# Patient Record
Sex: Female | Born: 1985
Health system: Southern US, Community
[De-identification: ages and names within clinical notes are randomized; demographics above are authoritative.]

## PROBLEM LIST (undated history)

## (undated) DIAGNOSIS — G40909 Epilepsy, unspecified, not intractable, without status epilepticus: Secondary | ICD-10-CM

## (undated) DIAGNOSIS — I639 Cerebral infarction, unspecified: Secondary | ICD-10-CM

## (undated) DIAGNOSIS — R638 Other symptoms and signs concerning food and fluid intake: Secondary | ICD-10-CM

## (undated) DIAGNOSIS — B3731 Acute candidiasis of vulva and vagina: Secondary | ICD-10-CM

## (undated) DIAGNOSIS — L709 Acne, unspecified: Secondary | ICD-10-CM

## (undated) DIAGNOSIS — N926 Irregular menstruation, unspecified: Secondary | ICD-10-CM

## (undated) DIAGNOSIS — F419 Anxiety disorder, unspecified: Secondary | ICD-10-CM

## (undated) DIAGNOSIS — B373 Candidiasis of vulva and vagina: Secondary | ICD-10-CM

## (undated) HISTORY — DX: Other symptoms and signs concerning food and fluid intake: R63.8

## (undated) HISTORY — DX: Candidiasis of vulva and vagina: B37.3

## (undated) HISTORY — DX: Anxiety disorder, unspecified: F41.9

## (undated) HISTORY — DX: Acne, unspecified: L70.9

## (undated) HISTORY — DX: Irregular menstruation, unspecified: N92.6

## (undated) HISTORY — PX: CHOLECYSTECTOMY: SHX55

## (undated) HISTORY — DX: Cerebral infarction, unspecified: I63.9

## (undated) HISTORY — DX: Acute candidiasis of vulva and vagina: B37.31

## (undated) HISTORY — PX: TONSILLECTOMY AND ADENOIDECTOMY: SUR1326

## (undated) HISTORY — DX: Epilepsy, unspecified, not intractable, without status epilepticus: G40.909

---

## 2007-07-19 ENCOUNTER — Emergency Department: Payer: Self-pay | Admitting: Emergency Medicine

## 2011-02-25 ENCOUNTER — Encounter: Payer: Self-pay | Admitting: Maternal and Fetal Medicine

## 2011-06-25 ENCOUNTER — Observation Stay: Payer: Self-pay | Admitting: Obstetrics and Gynecology

## 2011-06-28 ENCOUNTER — Observation Stay: Payer: Self-pay | Admitting: Obstetrics and Gynecology

## 2011-07-03 ENCOUNTER — Observation Stay: Payer: Self-pay | Admitting: Obstetrics and Gynecology

## 2011-07-03 LAB — PIH PROFILE
BUN: 9 mg/dL (ref 7–18)
Calcium, Total: 8.8 mg/dL (ref 8.5–10.1)
Co2: 22 mmol/L (ref 21–32)
Creatinine: 0.6 mg/dL (ref 0.60–1.30)
EGFR (Non-African Amer.): >60
Glucose: 69 mg/dL (ref 65–99)
HGB: 12.5 g/dL (ref 12.0–16.0)
MCH: 29.3 pg (ref 26.0–34.0)
MCHC: 34.6 g/dL (ref 32.0–36.0)
MCV: 85 fL (ref 80–100)
Potassium: 4.3 mmol/L (ref 3.5–5.1)
RDW: 13.2 % (ref 11.5–14.5)
SGOT(AST): 14 U/L — ABNORMAL LOW (ref 15–37)
Sodium: 139 mmol/L (ref 136–145)

## 2011-07-03 LAB — PROTEIN / CREATININE RATIO, URINE
Creatinine, Urine: 121.3 mg/dL (ref 30.0–125.0)
Protein/Creat. Ratio: 280 mg/gCREAT — ABNORMAL HIGH (ref 0–200)

## 2011-07-06 ENCOUNTER — Inpatient Hospital Stay: Payer: Self-pay | Admitting: Obstetrics and Gynecology

## 2011-07-07 LAB — PROTEIN / CREATININE RATIO, URINE
Creatinine, Urine: 76.5 mg/dL (ref 30.0–125.0)
Protein, Random Urine: 16 mg/dL — ABNORMAL HIGH (ref 0–12)
Protein/Creat. Ratio: 209 mg/gCREAT — ABNORMAL HIGH (ref 0–200)

## 2011-07-07 LAB — CBC WITH DIFFERENTIAL/PLATELET
Basophil #: 0 10*3/uL (ref 0.0–0.1)
Basophil %: 0.3 %
HCT: 36.6 % (ref 35.0–47.0)
HGB: 12.4 g/dL (ref 12.0–16.0)
Lymphocyte %: 20 %
MCH: 29.1 pg (ref 26.0–34.0)
MCHC: 33.9 g/dL (ref 32.0–36.0)
Monocyte %: 4.6 %
Neutrophil #: 8.3 10*3/uL — ABNORMAL HIGH (ref 1.4–6.5)
Neutrophil %: 74.8 %
Platelet: 248 10*3/uL (ref 150–440)
RDW: 13.9 % (ref 11.5–14.5)

## 2011-08-07 ENCOUNTER — Ambulatory Visit: Payer: Self-pay | Admitting: Neurology

## 2011-09-10 ENCOUNTER — Ambulatory Visit: Payer: Self-pay | Admitting: Obstetrics and Gynecology

## 2011-10-02 ENCOUNTER — Ambulatory Visit: Payer: Self-pay | Admitting: Surgery

## 2011-10-03 LAB — PATHOLOGY REPORT

## 2011-12-16 DIAGNOSIS — G40209 Localization-related (focal) (partial) symptomatic epilepsy and epileptic syndromes with complex partial seizures, not intractable, without status epilepticus: Secondary | ICD-10-CM | POA: Insufficient documentation

## 2012-01-13 ENCOUNTER — Emergency Department: Payer: Self-pay | Admitting: Emergency Medicine

## 2012-01-13 LAB — URINALYSIS, COMPLETE
Bacteria: NONE SEEN
Bilirubin,UR: NEGATIVE
Glucose,UR: NEGATIVE mg/dL (ref 0–75)
Leukocyte Esterase: NEGATIVE
Nitrite: NEGATIVE
Ph: 6 (ref 4.5–8.0)
Protein: NEGATIVE
RBC,UR: 2 /HPF (ref 0–5)

## 2012-01-13 LAB — CBC
HCT: 41.7 % (ref 35.0–47.0)
HGB: 14.6 g/dL (ref 12.0–16.0)
MCH: 28.9 pg (ref 26.0–34.0)
MCHC: 34.9 g/dL (ref 32.0–36.0)
MCV: 83 fL (ref 80–100)
Platelet: 230 10*3/uL (ref 150–440)
RDW: 14.2 % (ref 11.5–14.5)

## 2012-01-13 LAB — COMPREHENSIVE METABOLIC PANEL
Anion Gap: 4 — ABNORMAL LOW (ref 7–16)
Calcium, Total: 9.4 mg/dL (ref 8.5–10.1)
Co2: 30 mmol/L (ref 21–32)
Creatinine: 0.62 mg/dL (ref 0.60–1.30)
EGFR (African American): >60
EGFR (Non-African Amer.): >60
Glucose: 85 mg/dL (ref 65–99)
Osmolality: 272 (ref 275–301)
Potassium: 3.8 mmol/L (ref 3.5–5.1)
SGOT(AST): 21 U/L (ref 15–37)
Sodium: 137 mmol/L (ref 136–145)
Total Protein: 7.7 g/dL (ref 6.4–8.2)

## 2012-01-13 LAB — DRUG SCREEN, URINE
Amphetamines, Ur Screen: NEGATIVE (ref ?–1000)
Barbiturates, Ur Screen: NEGATIVE (ref ?–200)
Cocaine Metabolite,Ur ~~LOC~~: NEGATIVE (ref ?–300)
MDMA (Ecstasy)Ur Screen: NEGATIVE (ref ?–500)
Opiate, Ur Screen: NEGATIVE (ref ?–300)
Phencyclidine (PCP) Ur S: NEGATIVE (ref ?–25)
Tricyclic, Ur Screen: NEGATIVE (ref ?–1000)

## 2012-01-13 LAB — APTT: Activated PTT: 32.9 secs (ref 23.6–35.9)

## 2012-01-13 LAB — ETHANOL: Ethanol %: <0.003 % (ref 0.000–0.080)

## 2012-01-13 LAB — PROTIME-INR
INR: 0.9
Prothrombin Time: 12.6 secs (ref 11.5–14.7)

## 2012-10-23 ENCOUNTER — Ambulatory Visit: Payer: Self-pay

## 2014-03-02 LAB — HM PAP SMEAR: HM PAP: NEGATIVE

## 2014-08-28 NOTE — Op Note (Signed)
PATIENT NAME:  Briana Ward, Briana Ward MR#:  [id number] DATE OF BIRTH:  [birth year]  DATE OF PROCEDURE:  07/07/2011  PREOPERATIVE DIAGNOSES:  1. 39.0 week intrauterine pregnancy, undelivered.  2. Chronic hypertension.   3. Partial complex seizure disorder.  4. GBS positive.  5. Arrest of dilation.  POSTOPERATIVE DIAGNOSES: 1. 39.0 week intrauterine pregnancy, undelivered.  2. Chronic hypertension.   3. Partial complex seizure disorder.  4. GBS positive.  5. Arrest of dilation. 6. Viable female infant 5 pounds, 15 ounces.   OPERATIVE PROCEDURE: Primary low cervical transverse Cesarean section.   SURGEON: Prentice DockerMartin A. Lekendrick Alpern, MD    FIRST ASSISTANT: None.   ANESTHESIA: Spinal.   INDICATIONS: The patient is a 29 year old white female gravida 2 para 0-0-1-0 at 39.[redacted] weeks gestation who was admitted for Cytotec and Pitocin induction of labor secondary to chronic hypertension and partial complex seizure disorder. The patient had amniotomy with IUPC placement. Subsequent adequate labor of greater than 275 [place] units was obtained over several hours duration without any significant change beyond 4 cm, 80%, and -3 station with the head being noted to be out of the pelvis. The patient opted for Cesarean section delivery.   FINDINGS AT SURGERY: Viable female infant weighing 2680 grams (5 pounds 15 ounces), with Apgars of 8 and 9 at one and five minutes respectively. The infant did have nuchal cord x1 noted. Uterus, tubes, and ovaries were grossly normal. Intraoperative blood loss was 500 mL.   DESCRIPTION OF PROCEDURE: The patient was brought to the operating room where she was placed in the sitting position. Spinal anesthetic was introduced without difficulty. She was placed in supine position with a right lateral hip roll in place. Foley catheter was draining clear yellow urine from the bladder. After checking for adequate level of anesthesia and performing time-out, the procedure was performed in  routine fashion. Pfannenstiel skin incision was made in the abdomen. Fascia was incised in the midline transversely and spread bilaterally with Mayo scissors. Midline raphe was incised, separated, and peritoneum was entered. Bladder flap was created over the lower uterine segment through sharp dissection. Low transverse incision was made in the uterus. This was extended bluntly bilaterally. The infant was then delivered through a vertex presentation. A nuchal cord x1 was noted and reduced. The umbilical cord was doubly clamped and cut. The infant was handed off to the awaiting resuscitation team. Cord blood sampling was obtained. Placenta was expressed from the uterus. Uterus was externalized onto the anterior abdominal wall and cleared of all debris with laps. The incision was closed in layers with #1 chromic suture being used, the first layer being a running locking stitch, second layer was an imbricating layer. Bladder flap was also reapproximated. The uterus, tubes, and ovaries were noted to be normal. Uterus was placed back into the abdominal pelvic cavity. Gutters were cleared of all debris with laps. The incision was closed with layers using 0 Vicryl in a simple running manner. The skin was closed with staples. Pressure dressing was applied. The patient was then mobilized and taken to the recovery room in satisfactory condition.   Estimated blood loss was 500 mL. Complications were none. All instruments, needle, and sponge counts were verified as correct. The mom did receive clindamycin preoperative antibiotic prophylaxis.   ____________________________ Prentice DockerMartin A. Bennye Nix, MD mad:drc D: 07/07/2011 17:24:21 ET T: 07/08/2011 08:47:34 ET JOB#: [id number]  cc: Daphine DeutscherMartin A. Kendyl Bissonnette, MD, <Dictator> Prentice DockerMARTIN A Khadeejah Castner MD ELECTRONICALLY SIGNED 07/09/2011 13:53

## 2014-08-28 NOTE — Op Note (Signed)
PATIENT NAME:  Burnell BlanksWILLIAMS, Briana L MR#:  [id number] DATE OF BIRTH:  Dec 22, 1985  DATE OF PROCEDURE:  10/02/2011  PREOPERATIVE DIAGNOSIS: Chronic cholecystitis, cholelithiasis.   POSTOPERATIVE DIAGNOSIS: Chronic cholecystitis, cholelithiasis.   PROCEDURE: Laparoscopic cholecystectomy.   SURGEON: Renda RollsWilton Chrisma Hurlock, M.D.   ANESTHESIA: General.   INDICATIONS: This 29 year old female has a history of right upper quadrant pains and ultrasound findings of gallstones. Surgery was recommended for definitive treatment.   DESCRIPTION OF PROCEDURE: The patient was placed on the operating table in the supine position under general endotracheal anesthesia. The abdomen was prepared with ChloraPrep and draped in a sterile manner.   A short incision was made in the inferior aspect of the umbilicus and carried down to the deep fascia which was grasped with a laryngeal hook and elevated. A Veress needle was inserted, aspirated, and irrigated with a saline solution. Next, the peritoneal cavity was inflated with carbon dioxide. The Veress needle was removed. The 10-mm cannula was inserted. The 10-mm, 0-degree laparoscope was inserted to view the peritoneal cavity. Another incision was made in the epigastrium slightly to the right of the midline to introduce an 11-mm cannula. Two incisions were made in the lateral aspect of the right upper quadrant to introduce two 5-mm cannulas.   The liver appeared normal. There were a few adhesions between the omentum and the lower abdominal wall. The gallbladder was retracted towards the right shoulder. I could appreciate the presence of stones within the gallbladder. The porta hepatis was demonstrated. The pouch of Jon BillingsMorrison was retracted inferiorly and laterally. The cystic duct was dissected free from surrounding structures. The cystic artery was dissected free from surrounding structures. The neck of the gallbladder was mobilized with incision of the visceral peritoneum. A critical view  of safety was demonstrated. An endoclip was placed across the cystic duct adjacent to the neck of the gallbladder. An incision was made in the cystic duct to introduce a Reddick catheter. However, the catheter would not thread beyond about 4 to 5 mm and therefore a cholangiogram was not done. The Reddick catheter was removed. The cystic duct was doubly ligated with endoclips and divided. The cystic artery was controlled with double endoclips and divided. The gallbladder was dissected free from the liver with hook and cautery. Bleeding was very minimal. Hemostasis was subsequently intact. The gallbladder was delivered up through the infraumbilical incision, opened, and suctioned. The stone scoop was used to remove multiple stones. The gallbladder was then removed and submitted in formalin with stones for routine pathology. The right upper quadrant was further inspected. Hemostasis was intact. The cannulas were removed. Carbon dioxide was allowed to escape from the peritoneal cavity. Skin incisions were closed with interrupted 5-0 chromic subcuticular sutures, benzoin, and Steri-Strips. Dressings were applied with paper tape. The patient tolerated surgery satisfactorily and was then prepared for transfer to the recovery room.     ____________________________ Shela CommonsJ. Renda RollsWilton Charnette Younkin, MD jws:bjt D: 10/02/2011 11:52:45 ET T: 10/02/2011 13:12:12 ET JOB#: [id number]  cc: Adella HareJ. Wilton Jessi Pitstick, MD, <Dictator> Adella HareWILTON J Baron Parmelee MD ELECTRONICALLY SIGNED 10/04/2011 18:41

## 2014-09-13 NOTE — H&P (Signed)
L&D Evaluation:  History:   HPI 25 yowf G2P0010, last menstual period 10/07/2010, estimated date of confinement 07/14/2011, EGA 39.0 weeks admitted for IOL secondary to Chronic HTN and Partial Complex Seizure Disorder.    Patient's Medical History Epilepsy  Migraines; CVA - 1  day old; Increased BMI    Patient's Surgical History T&A; TAB    Medications Pre Natal Vitamins    Allergies PCN    Social History none    Family History Non-Contributory   ROS:   ROS All systems were reviewed.  HEENT, CNS, GI, GU, Respiratory, CV, Renal and Musculoskeletal systems were found to be normal.   Exam:   Vital Signs stable    General no apparent distress    Mental Status clear    Heart normal sinus rhythm    Abdomen gravid, tender with contractions    Estimated Fetal Weight Average for gestational age, 7#4    Back no CVAT    Edema 1+    Reflexes 1+    Pelvic 2/80/-2/VTX/BOWI (Pre Induction    Mebranes Intact    FHT normal rate with no decels    Skin dry    Lymph no lymphadenopathy    Other O+/ATB-/NR/RI/HB-/HIV-/GBS+   Impression:   Impression 39.0 week Intrauterine pregnancy with Chronic HTN, Seizure Disorder, +GBS   Plan:   Plan Cytotec/Pitocin IOL; PIH Panel; Epidural PRN, GBS Prophylaxis   Electronic Signatures: Caitrin Pendergraph, Prentice DockerMartin A (MD)  (Signed 03-Mar-13 13:09)  Authored: L&D Evaluation   Last Updated: 03-Mar-13 13:09 by Samyah Bilbo, Prentice DockerMartin A (MD)

## 2015-03-08 ENCOUNTER — Encounter: Payer: Self-pay | Admitting: Obstetrics and Gynecology

## 2015-03-09 ENCOUNTER — Encounter: Payer: Self-pay | Admitting: Obstetrics and Gynecology

## 2015-03-09 ENCOUNTER — Ambulatory Visit (INDEPENDENT_AMBULATORY_CARE_PROVIDER_SITE_OTHER): Payer: Medicaid Other | Admitting: Obstetrics and Gynecology

## 2015-03-09 VITALS — BP 125/80 | HR 94 | Ht 65.0 in | Wt 236.8 lb

## 2015-03-09 DIAGNOSIS — R638 Other symptoms and signs concerning food and fluid intake: Secondary | ICD-10-CM

## 2015-03-09 DIAGNOSIS — Z01419 Encounter for gynecological examination (general) (routine) without abnormal findings: Secondary | ICD-10-CM | POA: Diagnosis not present

## 2015-03-09 MED ORDER — INFLUENZA VAC SPLIT QUAD 0.5 ML IM SUSY
0.5000 mL | PREFILLED_SYRINGE | Freq: Once | INTRAMUSCULAR | Status: AC
  Administered 2015-03-09: 0.5 mL via INTRAMUSCULAR

## 2015-03-09 NOTE — Patient Instructions (Addendum)
1. Pap smear was done today, repeat in one year 2. Continue exercising 30 minutes a day, healthy eating, and weight loss. Avoid tobacco products. Minimize alcohol. 3. Self breast exams monthly 4. Screening labs to be done: Vitamin D, Lipid panel, Hemoglobin A1C, fasting blood sugar, TSH  Follow up in 1 year or sooner if needed   Influenza (Flu) Vaccine (Inactivated or Recombinant):  1. Why get vaccinated? Influenza ("flu") is a contagious disease that spreads around the [place] every year, usually between October and May. Flu is caused by influenza viruses, and is spread mainly by coughing, sneezing, and close contact. Anyone can get flu. Flu strikes suddenly and can last several days. Symptoms vary by age, but can include:  fever/chills  sore throat  muscle aches  fatigue  cough  headache  runny or stuffy nose Flu can also lead to pneumonia and blood infections, and cause diarrhea and seizures in children. If you have a medical condition, such as heart or lung disease, flu can make it worse. Flu is more dangerous for some people. Infants and young children, people 765 years of age and older, pregnant women, and people with certain health conditions or a weakened immune system are at greatest risk. Each year thousands of people in the [place] die from flu, and many more are hospitalized. Flu vaccine can:  keep you from getting flu,  make flu less severe if you do get it, and  keep you from spreading flu to your family and other people. 2. Inactivated and recombinant flu vaccines A dose of flu vaccine is recommended every flu season. Children 6 months through 748 years of age may need two doses during the same flu season. Everyone else needs only one dose each flu season. Some inactivated flu vaccines contain a very small amount of a mercury-based preservative called thimerosal. Studies have not shown thimerosal in vaccines to be harmful, but flu vaccines that do not  contain thimerosal are available. There is no live flu virus in flu shots. They cannot cause the flu. There are many flu viruses, and they are always changing. Each year a new flu vaccine is made to protect against three or four viruses that are likely to cause disease in the upcoming flu season. But even when the vaccine doesn't exactly match these viruses, it may still provide some protection. Flu vaccine cannot prevent:  flu that is caused by a virus not covered by the vaccine, or  illnesses that look like flu but are not. It takes about 2 weeks for protection to develop after vaccination, and protection lasts through the flu season. 3. Some people should not get this vaccine Tell the person who is giving you the vaccine:  If you have any severe, life-threatening allergies. If you ever had a life-threatening allergic reaction after a dose of flu vaccine, or have a severe allergy to any part of this vaccine, you may be advised not to get vaccinated. Most, but not all, types of flu vaccine contain a small amount of egg protein.  If you ever had Guillain-Barre Syndrome (also called GBS). Some people with a history of GBS should not get this vaccine. This should be discussed with your doctor.  If you are not feeling well. It is usually okay to get flu vaccine when you have a mild illness, but you might be asked to come back when you feel better. 4. Risks of a vaccine reaction With any medicine, including vaccines, there is a chance  of reactions. These are usually mild and go away on their own, but serious reactions are also possible. Most people who get a flu shot do not have any problems with it. Minor problems following a flu shot include:  soreness, redness, or swelling where the shot was given  hoarseness  sore, red or itchy eyes  cough  fever  aches  headache  itching  fatigue If these problems occur, they usually begin soon after the shot and last 1 or 2 days. More  serious problems following a flu shot can include the following:  There may be a small increased risk of Guillain-Barre Syndrome (GBS) after inactivated flu vaccine. This risk has been estimated at 1 or 2 additional cases per million people vaccinated. This is much lower than the risk of severe complications from flu, which can be prevented by flu vaccine.  Young children who get the flu shot along with pneumococcal vaccine (PCV13) and/or DTaP vaccine at the same time might be slightly more likely to have a seizure caused by fever. Ask your doctor for more information. Tell your doctor if a child who is getting flu vaccine has ever had a seizure. Problems that could happen after any injected vaccine:  People sometimes faint after a medical procedure, including vaccination. Sitting or lying down for about 15 minutes can help prevent fainting, and injuries caused by a fall. Tell your doctor if you feel dizzy, or have vision changes or ringing in the ears.  Some people get severe pain in the shoulder and have difficulty moving the arm where a shot was given. This happens very rarely.  Any medication can cause a severe allergic reaction. Such reactions from a vaccine are very rare, estimated at about 1 in a million doses, and would happen within a few minutes to a few hours after the vaccination. As with any medicine, there is a very remote chance of a vaccine causing a serious injury or death. The safety of vaccines is always being monitored. For more information, visit: http://floyd.org/ 5. What if there is a serious reaction? What should I look for?  Look for anything that concerns you, such as signs of a severe allergic reaction, very high fever, or unusual behavior. Signs of a severe allergic reaction can include hives, swelling of the face and throat, difficulty breathing, a fast heartbeat, dizziness, and weakness. These would start a few minutes to a few hours after the  vaccination. What should I do?  If you think it is a severe allergic reaction or other emergency that can't wait, call 9-1-1 and get the person to the nearest hospital. Otherwise, call your doctor.  Reactions should be reported to the Vaccine Adverse Event Reporting System (VAERS). Your doctor should file this report, or you can do it yourself through the VAERS web site at www.vaers.LAgents.no, or by calling 1-720-722-2657. VAERS does not give medical advice. 6. The National Vaccine Injury Compensation Program The [employer] Vaccine Injury Compensation Program (VICP) is a federal program that was created to compensate people who may have been injured by certain vaccines. Persons who believe they may have been injured by a vaccine can learn about the program and about filing a claim by calling 1-731-375-3008 or visiting the VICP website at SpiritualWord.at. There is a time limit to file a claim for compensation. 7. How can I learn more?  Ask your healthcare provider. He or she can give you the vaccine package insert or suggest other sources of information.  Call your  local or state health department.  Contact the Centers for Disease Control and Prevention (CDC):  Call 435 136 9031 (1-800-CDC-INFO) or  Visit CDC's website at BiotechRoom.com.cy Vaccine Information Statement Inactivated Influenza Vaccine (12/10/2013)   This information is not intended to replace advice given to you by your health care provider. Make sure you discuss any questions you have with your health care provider.   Document Released: 02/14/2006 Document Revised: 05/13/2014 Document Reviewed: 12/13/2013 Elsevier Interactive Patient Education [employer]

## 2015-03-09 NOTE — Progress Notes (Signed)
Patient ID: Briana Ward, female   DOB: [birth year], 29 y.o.   MRN: [id number] ANNUAL PREVENTATIVE CARE GYN  ENCOUNTER NOTE  Subjective:       Briana Blanksmily L Depolo is a 29 y.o. 712P1011 female here for a routine annual gynecologic exam.  Current complaints: 1.  Flu vaccine 2.  Pap smear 3.  Routine lab work   Gynecologic History No LMP recorded. Patient is not currently having periods (Reason: IUD). Contraception: IUD, Mirena Last Pap: 03/02/2014 neg. Results were: normal Last mammogram: n/a. Results were: n/a Sexually active, monogamous  Obstetric History OB History  Gravida Para Term Preterm AB SAB TAB Ectopic Multiple Living  2 1 1  1  1   1     # Outcome Date GA Lbr Len/2nd Weight Sex Delivery Anes PTL Lv  2 Term      CS-LTranv   Y  1 TAB               Past Medical History  Diagnosis Date  . Anxiety   . Irregular menses   . Stroke (HCC)   . Seizure disorder (HCC)   . Increased BMI   . Monilial vulvovaginitis   . Acne     Past Surgical History  Procedure Laterality Date  . Tonsillectomy and adenoidectomy    . Cholecystectomy    . Cesarean section      Current Outpatient Prescriptions on File Prior to Visit  Medication Sig Dispense Refill  . lamoTRIgine (LAMICTAL) 200 MG tablet Take 200 mg by mouth daily.    Marland Kitchen. levETIRAcetam (KEPPRA) 750 MG tablet Take 750 mg by mouth 2 (two) times daily.    Marland Kitchen. levonorgestrel (MIRENA) 20 MCG/24HR IUD 1 each by Intrauterine route once.     No current facility-administered medications on file prior to visit.    Allergies  Allergen Reactions  . Penicillins     Social History   Social History  . Marital Status: Single    Spouse Name: N/A  . Number of Children: N/A  . Years of Education: N/A   Occupational History  . Not on file.   Social History Main Topics  . Smoking status: Never Smoker   . Smokeless tobacco: Not on file  . Alcohol Use: No  . Drug Use: No  . Sexual Activity: Yes    Birth Control/ Protection: IUD    Other Topics Concern  . Not on file   Social History Narrative    Family History  Problem Relation Age of Onset  . Heart disease Mother   . Diabetes Maternal Aunt   . Breast cancer Maternal Aunt   . Osteoporosis Maternal Grandmother   . Colon cancer Neg Hx   . Ovarian cancer Neg Hx     The following portions of the patient's history were reviewed and updated as appropriate: allergies, current medications, past family history, past medical history, past social history, past surgical history and problem list.  Review of Systems ROS Review of Systems - General ROS: negative for - chills, fatigue, fever, hot flashes, night sweats, weight gain or weight loss Psychological ROS: negative for - anxiety, decreased libido, depression, mood swings, physical abuse or sexual abuse Ophthalmic ROS: negative for - blurry vision, eye pain or loss of vision ENT ROS: negative for - headaches, hearing change, visual changes or vocal changes Allergy and Immunology ROS: negative for - hives, itchy/watery eyes or seasonal allergies Hematological and Lymphatic ROS: negative for - bleeding problems, bruising, swollen lymph nodes or  weight loss Endocrine ROS: negative for - galactorrhea, hair pattern changes, hot flashes, malaise/lethargy, mood swings, palpitations, polydipsia/polyuria, skin changes, temperature intolerance or unexpected weight changes Breast ROS: negative for - new or changing breast lumps or nipple discharge Respiratory ROS: negative for - cough or shortness of breath Cardiovascular ROS: negative for - chest pain, irregular heartbeat, palpitations or shortness of breath Gastrointestinal ROS: no abdominal pain, change in bowel habits, or black or bloody stools Genito-Urinary ROS: no dysuria, trouble voiding, or hematuria Musculoskeletal ROS: negative for - joint pain or joint stiffness Neurological ROS: negative for - bowel and bladder control changes Dermatological ROS: negative for  rash and skin lesion changes   Objective:   BP 125/80 mmHg  Pulse 94  Ht  (1.651 m)  Wt 236 lb 12.8 oz (107.412 kg)  BMI 39.41 kg/m2 CONSTITUTIONAL: Well-developed, well-nourished female in no acute distress.  PSYCHIATRIC: Normal mood and affect. Normal behavior NEUROLGIC: Alert and oriented to person, place, and time. Normal muscle tone coordination.  HENT:  Normocephalic, atraumatic EYES: Conjunctivae and EOM are normal. .  NECK: Normal range of motion, supple, no masses.  Normal thyroid.  SKIN: Skin is warm and dry. No rash noted.  CARDIOVASCULAR: Normal heart rate noted, regular rhythm, no murmur. RESPIRATORY: Clear to auscultation bilaterally.  BREASTS: Symmetric in size. No masses, skin changes, nipple drainage, or lymphadenopathy. ABDOMEN: Soft, normal bowel sounds, no distention noted.  No tenderness, rebound or guarding.  BLADDER: Normal PELVIC:  External Genitalia: Normal  BUS: Normal  Vagina: Normal discharge  Cervix: Normal, no cervical motion tenderness; IUD string visualized  Uterus: Not palpable due to body habitus.  Adnexa: No masses  RV: External Exam NormaI  MUSCULOSKELETAL: Normal range of motion.  LYMPHATIC: No Axillary, Supraclavicular, or Inguinal Adenopathy.   Assessment:   Annual gynecologic examination 29 y.o. Contraception: Mirena IUD Obesity 2   Plan:  Pap: Pap, Reflex if ASCUS Mammogram: Not Indicated Stool Guaiac Testing: Not indicated Labs: vit d lipid a1c fbs tsh Routine preventative health maintenance measures emphasized: Exercise/Diet/Weight control, Tobacco Warnings, Alcohol/Substance use risks and Safe Sex. Recommended working to lose 1 pound per month with healthy eating and exercise.  Return to Clinic - [address] [place], [place] Doreene Nest, PA-S Herold Harms, MD   I have seen, interviewed, and examined the patient in conjunction with the [facility].A. student and affirm the diagnosis and management  plan. Ragen Laver A. Deysi Soldo, MD, Evern Core

## 2015-03-12 LAB — PAP IG W/ RFLX HPV ASCU: PAP Smear Comment: 0

## 2015-03-16 DIAGNOSIS — Z8673 Personal history of transient ischemic attack (TIA), and cerebral infarction without residual deficits: Secondary | ICD-10-CM | POA: Insufficient documentation

## 2015-03-16 DIAGNOSIS — R638 Other symptoms and signs concerning food and fluid intake: Secondary | ICD-10-CM | POA: Insufficient documentation

## 2015-03-16 DIAGNOSIS — I639 Cerebral infarction, unspecified: Secondary | ICD-10-CM | POA: Insufficient documentation

## 2015-03-16 DIAGNOSIS — E669 Obesity, unspecified: Secondary | ICD-10-CM | POA: Insufficient documentation

## 2015-10-19 ENCOUNTER — Telehealth: Payer: Self-pay | Admitting: Obstetrics and Gynecology

## 2015-10-19 NOTE — Telephone Encounter (Signed)
Pt will bring forms by office.  Will review and contact pt.

## 2015-10-19 NOTE — Telephone Encounter (Signed)
This pt needs some injections for school. She wants to know if she can get that here. Please call her so she can tell you what she needs.

## 2015-10-26 ENCOUNTER — Telehealth: Payer: Self-pay | Admitting: Obstetrics and Gynecology

## 2015-10-26 NOTE — Telephone Encounter (Signed)
Pt advised to bring form in and I will review.

## 2015-10-26 NOTE — Telephone Encounter (Signed)
PT CALLED AND SAID SHE TALKED TO YOU LAST WEEK. SHE WANTED YOU TO CALL HER

## 2015-10-27 ENCOUNTER — Ambulatory Visit: Payer: Medicaid Other

## 2015-10-30 ENCOUNTER — Ambulatory Visit (INDEPENDENT_AMBULATORY_CARE_PROVIDER_SITE_OTHER): Payer: BLUE CROSS/BLUE SHIELD

## 2015-10-30 VITALS — BP 122/82 | HR 80 | Ht 65.0 in | Wt 238.3 lb

## 2015-10-30 DIAGNOSIS — Z020 Encounter for examination for admission to educational institution: Secondary | ICD-10-CM

## 2015-10-30 DIAGNOSIS — Z0289 Encounter for other administrative examinations: Secondary | ICD-10-CM | POA: Diagnosis not present

## 2015-10-30 DIAGNOSIS — Z23 Encounter for immunization: Secondary | ICD-10-CM | POA: Diagnosis not present

## 2015-10-30 MED ORDER — TETANUS-DIPHTH-ACELL PERTUSSIS 5-2.5-18.5 LF-MCG/0.5 IM SUSP
0.5000 mL | Freq: Once | INTRAMUSCULAR | Status: AC
  Administered 2015-10-30: 0.5 mL via INTRAMUSCULAR

## 2015-10-30 NOTE — Progress Notes (Signed)
Patient ID: Briana Ward, female   DOB: 1985-09-18, 30 y.o.   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 Pt will be contacted once labs are back. Paperwork to be filled out at that time.

## 2015-10-31 LAB — RUBELLA SCREEN: Rubella Antibodies, IGG: 7.23 index (ref >0.99)

## 2015-10-31 LAB — URINALYSIS, ROUTINE W REFLEX MICROSCOPIC
BILIRUBIN UA: NEGATIVE
GLUCOSE, UA: NEGATIVE
KETONES UA: NEGATIVE
LEUKOCYTES UA: NEGATIVE
NITRITE UA: NEGATIVE
Protein, UA: NEGATIVE
RBC UA: NEGATIVE
SPEC GRAV UA: 1.021 (ref 1.005–1.030)
Urobilinogen, Ur: 0.2 mg/dL (ref 0.2–1.0)
pH, UA: 6 (ref 5.0–7.5)

## 2015-10-31 LAB — VARICELLA ZOSTER ANTIBODY, IGG: Varicella zoster IgG: 1849 index (ref >165)

## 2015-11-03 LAB — QUANTIFERON IN TUBE
QFT TB AG MINUS NIL VALUE: 0.03 [IU]/mL
QUANTIFERON NIL VALUE: 0.03 [IU]/mL
QUANTIFERON TB AG VALUE: 0.06 [IU]/mL
QUANTIFERON TB GOLD: NEGATIVE

## 2015-11-03 LAB — QUANTIFERON TB GOLD ASSAY (BLOOD)

## 2017-04-10 ENCOUNTER — Other Ambulatory Visit: Payer: Self-pay | Admitting: General Surgery

## 2017-04-10 DIAGNOSIS — M7989 Other specified soft tissue disorders: Secondary | ICD-10-CM

## 2017-04-15 ENCOUNTER — Ambulatory Visit
Admission: RE | Admit: 2017-04-15 | Discharge: 2017-04-15 | Disposition: A | Discharge status: Home / Self Care | Payer: 59 | Source: Ambulatory Visit | Attending: General Surgery | Admitting: General Surgery

## 2017-04-15 DIAGNOSIS — M7989 Other specified soft tissue disorders: Secondary | ICD-10-CM | POA: Diagnosis not present

## 2017-11-04 ENCOUNTER — Other Ambulatory Visit: Payer: Self-pay | Admitting: Neurology

## 2017-11-04 DIAGNOSIS — G40209 Localization-related (focal) (partial) symptomatic epilepsy and epileptic syndromes with complex partial seizures, not intractable, without status epilepticus: Secondary | ICD-10-CM

## 2017-11-04 DIAGNOSIS — G40109 Localization-related (focal) (partial) symptomatic epilepsy and epileptic syndromes with simple partial seizures, not intractable, without status epilepticus: Principal | ICD-10-CM

## 2017-11-19 ENCOUNTER — Ambulatory Visit: Payer: 59

## 2017-11-21 ENCOUNTER — Ambulatory Visit
Admission: RE | Admit: 2017-11-21 | Discharge: 2017-11-21 | Disposition: A | Discharge status: Home / Self Care | Payer: 59 | Source: Ambulatory Visit | Attending: Neurology | Admitting: Neurology

## 2018-01-29 ENCOUNTER — Other Ambulatory Visit: Payer: Self-pay

## 2018-01-29 ENCOUNTER — Emergency Department
Admission: EM | Admit: 2018-01-29 | Discharge: 2018-01-29 | Disposition: A | Discharge status: Home / Self Care | Payer: 59 | Attending: Emergency Medicine | Admitting: Emergency Medicine

## 2018-01-29 ENCOUNTER — Encounter: Payer: Self-pay | Admitting: Emergency Medicine

## 2018-01-29 DIAGNOSIS — R569 Unspecified convulsions: Secondary | ICD-10-CM | POA: Insufficient documentation

## 2018-01-29 DIAGNOSIS — Z79899 Other long term (current) drug therapy: Secondary | ICD-10-CM | POA: Insufficient documentation

## 2018-01-29 DIAGNOSIS — Z8673 Personal history of transient ischemic attack (TIA), and cerebral infarction without residual deficits: Secondary | ICD-10-CM | POA: Insufficient documentation

## 2018-01-29 LAB — BASIC METABOLIC PANEL
Anion gap: 16 — ABNORMAL HIGH (ref 5–15)
BUN: 14 mg/dL (ref 6–20)
CO2: 19 mmol/L — ABNORMAL LOW (ref 22–32)
Calcium: 9.3 mg/dL (ref 8.9–10.3)
Chloride: 102 mmol/L (ref 98–111)
Creatinine, Ser: 0.91 mg/dL (ref 0.44–1.00)
GFR calc Af Amer: >60 mL/min (ref >60)
Glucose, Bld: 102 mg/dL — ABNORMAL HIGH (ref 70–99)
POTASSIUM: 3.8 mmol/L (ref 3.5–5.1)
SODIUM: 137 mmol/L (ref 135–145)

## 2018-01-29 LAB — CBC
HCT: 39.6 % (ref 35.0–47.0)
Hemoglobin: 13.8 g/dL (ref 12.0–16.0)
MCH: 30.1 pg (ref 26.0–34.0)
MCHC: 34.8 g/dL (ref 32.0–36.0)
MCV: 86.4 fL (ref 80.0–100.0)
PLATELETS: 295 10*3/uL (ref 150–440)
RBC: 4.58 MIL/uL (ref 3.80–5.20)
RDW: 13.7 % (ref 11.5–14.5)
WBC: 9.6 10*3/uL (ref 3.6–11.0)

## 2018-01-29 LAB — HCG, QUANTITATIVE, PREGNANCY

## 2018-01-29 MED ORDER — SODIUM CHLORIDE 0.9 % IV BOLUS
1000.0000 mL | Freq: Once | INTRAVENOUS | Status: AC
  Administered 2018-01-29: 1000 mL via INTRAVENOUS

## 2018-01-29 MED ORDER — IBUPROFEN 600 MG PO TABS
600.0000 mg | ORAL_TABLET | Freq: Once | ORAL | Status: AC
  Administered 2018-01-29: 600 mg via ORAL
  Filled 2018-01-29: qty 1

## 2018-01-29 MED ORDER — LEVETIRACETAM IN NACL 1500 MG/100ML IV SOLN
1500.0000 mg | Freq: Once | INTRAVENOUS | Status: AC
  Administered 2018-01-29: 1500 mg via INTRAVENOUS
  Filled 2018-01-29: qty 100

## 2018-01-29 MED ORDER — LORAZEPAM 2 MG/ML IJ SOLN
INTRAMUSCULAR | Status: AC
  Filled 2018-01-29: qty 1

## 2018-01-29 NOTE — ED Triage Notes (Signed)
Pt to ED via EMS from [facility] with witnessed seizure. PT hx of seizures takes kepra daily. Per EMS pt was postictal on scene. VSS. PT denies any pain.

## 2018-01-29 NOTE — ED Provider Notes (Addendum)
[facility] Emergency Department Provider Note  ____________________________________________  Time seen: Approximately 7:46 PM  I have reviewed the triage vital signs and the nursing notes.   HISTORY  Chief Complaint Seizures   HPI Briana Ward is a 32 y.o. female with a history of epilepsy who presents for evaluation of seizure.  Patient was at a restaurant with the family member when she had a generalized tonic-clonic seizure lasting about a minute.  When EMS arrived patient was postictal however upon arrival to the emergency room she is back to baseline.  Patient is followed by Dr. Sherryll Burger.  She is supposed to be on Keppra 500 mg twice daily but according to the patient she is only been taking it once a day and this morning she was in a rush to get out of the house and forgot to take her medication.  She denies any recent illness.  She does endorse having a Mirena IUD placed 2 weeks ago.  She reports that her seizures are brought on by stress or hormonal changes.  She also started a new job 3 days ago and has been under a lot of stress due to the anticipation of the new job.  She denies headache, chest pain.  She did not fall or sustain any injuries during the seizure episode.  Past Medical History:  Diagnosis Date  . Acne   . Anxiety   . Increased BMI   . Irregular menses   . Monilial vulvovaginitis   . Seizure disorder (HCC)   . Stroke [facility])     Patient Active Problem List   Diagnosis Date Noted  . Ischemic stroke (HCC) 03/16/2015  . Increased BMI 03/16/2015  . Partial epilepsy with impairment of consciousness (HCC) 12/16/2011    Past Surgical History:  Procedure Laterality Date  . CESAREAN SECTION    . CHOLECYSTECTOMY    . TONSILLECTOMY AND ADENOIDECTOMY      Prior to Admission medications   Medication Sig Start Date End Date Taking? Authorizing Provider  lamoTRIgine (LAMICTAL) 100 MG tablet  09/27/15   [provider]    levETIRAcetam (KEPPRA) 750 MG tablet Take 750 mg by mouth 2 (two) times daily.    [provider]  levonorgestrel (MIRENA) 20 MCG/24HR IUD 1 each by Intrauterine route once.    [provider]    Allergies Penicillins  Family History  Problem Relation Age of Onset  . Heart disease Mother   . Diabetes Maternal Aunt   . Breast cancer Maternal Aunt   . Osteoporosis Maternal Grandmother   . Colon cancer Neg Hx   . Ovarian cancer Neg Hx     Social History Social History   Tobacco Use  . Smoking status: Never Smoker  . Smokeless tobacco: Never Used  Substance Use Topics  . Alcohol use: No  . Drug use: No    Review of Systems  Constitutional: Negative for fever. Eyes: Negative for visual changes. ENT: Negative for sore throat. Neck: No neck pain  Cardiovascular: Negative for chest pain. Respiratory: Negative for shortness of breath. Gastrointestinal: Negative for abdominal pain, vomiting or diarrhea. Genitourinary: Negative for dysuria. Musculoskeletal: Negative for back pain. Skin: Negative for rash. Neurological: Negative for headaches, weakness or numbness. + seizure Psych: No SI or HI  ____________________________________________   PHYSICAL EXAM:  VITAL SIGNS: ED Triage Vitals  Enc Vitals Group     BP 01/29/18 1830 (!) 127/99     Pulse Rate 01/29/18 1806 (!) 101  Resp 01/29/18 1804 16     Temp 01/29/18 1805 98.5 F (36.9 C)     Temp Source 01/29/18 1805 Oral     SpO2 01/29/18 1806 98 %     Weight 01/29/18 1805 205 lb (93 kg)     Height 01/29/18 1804 5\' 5"  (1.651 m)     Head Circumference --      Peak Flow --      Pain Score 01/29/18 1804 0     Pain Loc --      Pain Edu? --      Excl. in GC? --     Constitutional: Alert and oriented. Well appearing and in no apparent distress. HEENT:      Head: Normocephalic and atraumatic.         Eyes: Conjunctivae are normal. Sclera is non-icteric.       Mouth/Throat: Mucous membranes are  moist.       Neck: Supple with no signs of meningismus. Cardiovascular: Regular rate and rhythm. No murmurs, gallops, or rubs. 2+ symmetrical distal pulses are present in all extremities. No JVD. Respiratory: Normal respiratory effort. Lungs are clear to auscultation bilaterally. No wheezes, crackles, or rhonchi.  Gastrointestinal: Soft, non tender, and non distended with positive bowel sounds. No rebound or guarding. Musculoskeletal: Nontender with normal range of motion in all extremities. No edema, cyanosis, or erythema of extremities. Neurologic: Normal speech and language. A & O x3, PERRL, EOMI, no nystagmus, CN II-XII intact, motor testing reveals good tone and bulk throughout. There is no evidence of pronator drift or dysmetria. Muscle strength is 5/5 throughout. Sensory examination is intact. Gait is normal. Skin: Skin is warm, dry and intact. No rash noted. Psychiatric: Mood and affect are normal. Speech and behavior are normal.  ____________________________________________   LABS (all labs ordered are listed, but only abnormal results are displayed)  Labs Reviewed  BASIC METABOLIC PANEL - Abnormal; Notable for the following components:      Result Value   CO2 19 (*)    Glucose, Bld 102 (*)    Anion gap 16 (*)    All other components within normal limits  CBC  HCG, QUANTITATIVE, PREGNANCY  URINALYSIS, COMPLETE (UACMP) WITH MICROSCOPIC  LEVETIRACETAM LEVEL   ____________________________________________  EKG  none  ____________________________________________  RADIOLOGY  none  ____________________________________________   PROCEDURES  Procedure(s) performed: None Procedures Critical Care performed:  Yes  CRITICAL CARE Performed by: Nita Sickle  ?  Total critical care time: 35 min  Critical care time was exclusive of separately billable procedures and treating other patients.  Critical care was necessary to treat or prevent imminent or  life-threatening deterioration.  Critical care was time spent personally by me on the following activities: development of treatment plan with patient and/or surrogate as well as nursing, discussions with consultants, evaluation of patient's response to treatment, examination of patient, obtaining history from patient or surrogate, ordering and performing treatments and interventions, ordering and review of laboratory studies, ordering and review of radiographic studies, pulse oximetry and re-evaluation of patient's condition.  ____________________________________________   INITIAL IMPRESSION / ASSESSMENT AND PLAN / ED COURSE  32 y.o. female with a history of epilepsy who presents for evaluation of seizure.  Several possible reasons why patient had a seizure today including recent placement of a Mirena IUD, stress related to a new job, the fact the patient is taking her medications inappropriately and missed her dose this morning.  Since patient has been taking the Keppra once a  day instead of twice a day anticipate that her levels will be low therefore I will load her with 1500 mg of Keppra.  Will check labs to rule out electrolyte abnormalities as possible etiologies of her seizure.  Will check pregnancy test. Will monitor for recurrence of seizure in the ED.  Clinical Course as of Jan 29 2100  Thu Jan 29, 2018  2100 Patient remained stable with no further episodes of seizure.  Will discharge home.  I reinforced the patient should be taking Keppra twice a day and not once a day.  Recommend close follow-up with Dr. Clelia Croft.  Seizure precautions have been discussed with her peer   [CV]    Clinical Course User Index [CV] Don Perking, [place], MD     As part of my medical decision making, I reviewed the following data within the electronic MEDICAL RECORD NUMBER History obtained from family, Nursing notes reviewed and incorporated, Labs reviewed , Old chart reviewed, Notes from prior ED visits and [place]  Controlled Substance Database    Pertinent labs & imaging results that were available during my care of the patient were reviewed by me and considered in my medical decision making (see chart for details).    ____________________________________________   FINAL CLINICAL IMPRESSION(S) / ED DIAGNOSES  Final diagnoses:  Seizure (HCC)      NEW MEDICATIONS STARTED DURING THIS VISIT:  ED Discharge Orders    None       Note:  This document was prepared using Dragon voice recognition software and may include unintentional dictation errors.    Don Perking, [place], MD 01/29/18 0981    Nita Sickle, MD 02/08/18 [phone]

## 2018-01-29 NOTE — Discharge Instructions (Addendum)
Seizures may happen at any time. It is important to take certain precautions to maintain your safety. DO NOT DRIVE OR OPERATE MACHINERY UNTIL CLEARED BY YOUR NEUROLOGIST.  Follow up with your doctor in 1-3 days.  Take Keppra 500mg  twice a day.  During a seizure, a person may injure himself or herself. Seizure precautions are guidelines that a person can follow in order to minimize injury during a seizure. For any activity, it is important to ask, "What would happen if I had a seizure while doing this?" Follow the below precautions.  Bathroom Safety  A person with seizures may want to shower instead of bathe to avoid accidental drowning. If falls occur during the patient's typical seizure, a person should use a shower seat, preferably one with a safety strap.  Use nonskid strips in your shower or tub.  Never use electrical equipment near water. This prevents accidental electrocution.  Consider changing glass in shower doors to shatterproof glass.  Secondary school teacher If possible, cook when someone else is nearby.  Use the back burners of the stove to prevent accidental burns.  Use shatterproof containers as much as possible. For instance, sauces can be transferred from glass bottles to plastic containers for use.  Limit time that is required using knives or other sharp objects. If possible, buy foods that are already cut, or ask someone to help in meal preparation.   General Safety at Home Do not smoke or light fires in the fireplace unless someone else is present.  Do not use space heaters that can be accidentally overturned.  When alone, avoid using step stools or ladders, and do not clean rooftop gutters.  Purchase power tools and motorized Risk manager which have a safety switch that will stop the machine if you release the handle (a 'dead man's' switch).   Driving and Transportation DO NOT DRIVE UNTIL YOU ARE CLEARED BY A NEUROLOGIST and/or you have permission to drive from your state's  Department of Motor Vehicles  [facility]). Each state has different laws. Please refer to the following link on the Epilepsy Foundation of America's website for more information: http://www.epilepsyfoundation.org/answerplace/Social/driving/drivingu.cfm  If you ride a bicycle, wear a helmet and any other necessary protective gear.  When taking public transportation like the bus or subway, stay clear of the platform edge.   Outdoor Theatre manager is okay, but does present certain risks. Never swim alone, and tell friends what to do if you have a seizure while swimming.  Wear appropriate protective equipment.  Ski with a friend. If a seizure occurs, your friend can seek help, if needed. He or she can also help to get you out of the cold. Consider using a safety hook or belt while riding the ski lift.

## 2018-01-29 NOTE — ED Notes (Signed)
Per friend states pt seized for at least one minute,

## 2018-01-29 NOTE — ED Notes (Signed)
ED Provider at bedside. 

## 2018-01-31 LAB — LEVETIRACETAM LEVEL: LEVETIRACETAM: NOT DETECTED ug/mL (ref 10.0–40.0)

## 2018-03-16 ENCOUNTER — Other Ambulatory Visit: Payer: Self-pay | Admitting: Neurology

## 2018-03-16 DIAGNOSIS — R569 Unspecified convulsions: Secondary | ICD-10-CM

## 2018-03-31 ENCOUNTER — Ambulatory Visit: Payer: 59

## 2018-04-08 DIAGNOSIS — R569 Unspecified convulsions: Secondary | ICD-10-CM | POA: Insufficient documentation

## 2018-04-15 ENCOUNTER — Ambulatory Visit: Payer: 59

## 2018-11-10 ENCOUNTER — Other Ambulatory Visit: Payer: Self-pay | Admitting: Certified Nurse Midwife

## 2018-11-10 DIAGNOSIS — N6312 Unspecified lump in the right breast, upper inner quadrant: Secondary | ICD-10-CM

## 2018-11-16 ENCOUNTER — Ambulatory Visit
Admission: RE | Admit: 2018-11-16 | Discharge: 2018-11-16 | Disposition: A | Discharge status: Home / Self Care | Payer: Managed Care, Other (non HMO) | Source: Ambulatory Visit | Attending: Certified Nurse Midwife | Admitting: Certified Nurse Midwife

## 2018-11-16 ENCOUNTER — Other Ambulatory Visit: Payer: Self-pay

## 2018-11-16 DIAGNOSIS — N6312 Unspecified lump in the right breast, upper inner quadrant: Secondary | ICD-10-CM

## 2018-11-18 ENCOUNTER — Other Ambulatory Visit: Payer: Self-pay | Admitting: Certified Nurse Midwife

## 2018-11-18 DIAGNOSIS — R928 Other abnormal and inconclusive findings on diagnostic imaging of breast: Secondary | ICD-10-CM

## 2018-11-18 DIAGNOSIS — N6310 Unspecified lump in the right breast, unspecified quadrant: Secondary | ICD-10-CM

## 2018-11-19 ENCOUNTER — Ambulatory Visit
Admission: RE | Admit: 2018-11-19 | Discharge: 2018-11-19 | Disposition: A | Discharge status: Home / Self Care | Payer: Managed Care, Other (non HMO) | Source: Ambulatory Visit | Attending: Certified Nurse Midwife | Admitting: Certified Nurse Midwife

## 2018-11-19 ENCOUNTER — Other Ambulatory Visit: Payer: Self-pay

## 2018-11-19 DIAGNOSIS — N6310 Unspecified lump in the right breast, unspecified quadrant: Secondary | ICD-10-CM | POA: Insufficient documentation

## 2018-11-19 DIAGNOSIS — R928 Other abnormal and inconclusive findings on diagnostic imaging of breast: Secondary | ICD-10-CM | POA: Insufficient documentation

## 2019-08-31 ENCOUNTER — Encounter: Payer: Self-pay | Admitting: *Deleted

## 2019-08-31 ENCOUNTER — Emergency Department
Admission: EM | Admit: 2019-08-31 | Discharge: 2019-09-01 | Disposition: A | Discharge status: Home / Self Care | Payer: Managed Care, Other (non HMO) | Attending: Emergency Medicine | Admitting: Emergency Medicine

## 2019-08-31 ENCOUNTER — Other Ambulatory Visit: Payer: Self-pay

## 2019-08-31 DIAGNOSIS — Z5321 Procedure and treatment not carried out due to patient leaving prior to being seen by health care provider: Secondary | ICD-10-CM | POA: Diagnosis not present

## 2019-08-31 DIAGNOSIS — I10 Essential (primary) hypertension: Secondary | ICD-10-CM | POA: Insufficient documentation

## 2019-08-31 LAB — TROPONIN I (HIGH SENSITIVITY): Troponin I (High Sensitivity): <2 ng/L (ref <18)

## 2019-08-31 NOTE — ED Triage Notes (Signed)
Pt to ED reporting HTN for starting today. Pt reports she was seen a couple weeks ago and it was normal. Pt was seen at [facility] clinic today and blood work looked normal. Pt reports continued HTN and multiple episodes of feeling as though she was going to have a seizure.  Pt has not had a seizure in over a year and reports she has been taking medications as prescribed.

## 2019-09-29 ENCOUNTER — Other Ambulatory Visit: Payer: Self-pay | Admitting: Dermatology

## 2019-10-04 DIAGNOSIS — F1090 Alcohol use, unspecified, uncomplicated: Secondary | ICD-10-CM | POA: Insufficient documentation

## 2019-10-04 DIAGNOSIS — Z789 Other specified health status: Secondary | ICD-10-CM | POA: Insufficient documentation

## 2019-10-18 DIAGNOSIS — I10 Essential (primary) hypertension: Secondary | ICD-10-CM | POA: Insufficient documentation

## 2019-11-09 ENCOUNTER — Other Ambulatory Visit: Payer: Self-pay | Admitting: Physical Medicine & Rehabilitation

## 2019-11-09 DIAGNOSIS — M5412 Radiculopathy, cervical region: Secondary | ICD-10-CM

## 2019-11-09 DIAGNOSIS — M542 Cervicalgia: Secondary | ICD-10-CM

## 2019-11-23 ENCOUNTER — Ambulatory Visit: Payer: Managed Care, Other (non HMO)

## 2019-12-06 ENCOUNTER — Other Ambulatory Visit: Payer: Self-pay

## 2019-12-06 ENCOUNTER — Ambulatory Visit
Admission: RE | Admit: 2019-12-06 | Discharge: 2019-12-06 | Disposition: A | Discharge status: Home / Self Care | Payer: Managed Care, Other (non HMO) | Source: Ambulatory Visit | Attending: Physical Medicine & Rehabilitation | Admitting: Physical Medicine & Rehabilitation

## 2019-12-06 DIAGNOSIS — M5412 Radiculopathy, cervical region: Secondary | ICD-10-CM | POA: Diagnosis present

## 2019-12-06 DIAGNOSIS — M542 Cervicalgia: Secondary | ICD-10-CM | POA: Insufficient documentation

## 2019-12-07 DIAGNOSIS — G8929 Other chronic pain: Secondary | ICD-10-CM | POA: Insufficient documentation

## 2019-12-07 DIAGNOSIS — M4802 Spinal stenosis, cervical region: Secondary | ICD-10-CM | POA: Insufficient documentation

## 2019-12-07 DIAGNOSIS — M542 Cervicalgia: Secondary | ICD-10-CM | POA: Insufficient documentation

## 2019-12-07 DIAGNOSIS — G5622 Lesion of ulnar nerve, left upper limb: Secondary | ICD-10-CM | POA: Insufficient documentation

## 2019-12-07 DIAGNOSIS — M5412 Radiculopathy, cervical region: Secondary | ICD-10-CM | POA: Insufficient documentation

## 2020-05-30 ENCOUNTER — Other Ambulatory Visit: Payer: Self-pay | Admitting: Neurology

## 2020-06-19 ENCOUNTER — Other Ambulatory Visit: Payer: Self-pay | Admitting: Family Medicine

## 2020-08-24 ENCOUNTER — Other Ambulatory Visit: Payer: Self-pay

## 2020-08-24 MED FILL — Losartan Potassium Tab 25 MG: ORAL | 90 days supply | Qty: 90 | Fill #0 | Status: AC

## 2020-10-03 ENCOUNTER — Other Ambulatory Visit: Payer: Self-pay

## 2020-10-03 MED FILL — Escitalopram Oxalate Tab 10 MG (Base Equiv): ORAL | 90 days supply | Qty: 90 | Fill #0 | Status: AC

## 2020-10-03 MED FILL — Propranolol HCl Tab 20 MG: ORAL | 30 days supply | Qty: 60 | Fill #0 | Status: AC

## 2020-10-03 MED FILL — Buspirone HCl Tab 7.5 MG: ORAL | 30 days supply | Qty: 90 | Fill #0 | Status: AC

## 2020-10-04 ENCOUNTER — Other Ambulatory Visit: Payer: Self-pay

## 2020-10-05 ENCOUNTER — Other Ambulatory Visit: Payer: Self-pay

## 2020-10-05 MED ORDER — SAXENDA 18 MG/3ML ~~LOC~~ SOPN
PEN_INJECTOR | SUBCUTANEOUS | 0 refills | Status: DC
  Filled 2020-10-05 – 2020-10-10 (×2): qty 15, 30d supply, fill #0

## 2020-10-06 ENCOUNTER — Other Ambulatory Visit: Payer: Self-pay

## 2020-10-09 ENCOUNTER — Other Ambulatory Visit: Payer: Self-pay

## 2020-10-10 ENCOUNTER — Other Ambulatory Visit: Payer: Self-pay

## 2020-10-10 MED ORDER — ESCITALOPRAM OXALATE 20 MG PO TABS
ORAL_TABLET | ORAL | 3 refills | Status: DC
  Filled 2020-10-10: qty 90, 90d supply, fill #0
  Filled 2021-08-22: qty 90, 90d supply, fill #1

## 2020-10-10 MED ORDER — ESCITALOPRAM OXALATE 20 MG PO TABS
ORAL_TABLET | ORAL | 3 refills | Status: DC
  Filled 2021-02-06: qty 90, 90d supply, fill #0
  Filled 2021-06-22: qty 90, 90d supply, fill #1

## 2020-10-10 MED ORDER — UNIFINE PENTIPS 31G X 5 MM MISC
0 refills | Status: DC
  Filled 2020-10-10: qty 100, 90d supply, fill #0

## 2020-10-11 ENCOUNTER — Other Ambulatory Visit: Payer: Self-pay

## 2020-10-13 ENCOUNTER — Other Ambulatory Visit: Payer: Self-pay

## 2020-10-26 ENCOUNTER — Other Ambulatory Visit: Payer: Self-pay

## 2020-10-26 ENCOUNTER — Ambulatory Visit (INDEPENDENT_AMBULATORY_CARE_PROVIDER_SITE_OTHER): Payer: No Typology Code available for payment source | Admitting: Nurse Practitioner

## 2020-10-26 VITALS — BP 120/83 | HR 82 | Ht 64.0 in | Wt 254.0 lb

## 2020-10-26 DIAGNOSIS — M7989 Other specified soft tissue disorders: Secondary | ICD-10-CM | POA: Diagnosis not present

## 2020-10-26 DIAGNOSIS — I10 Essential (primary) hypertension: Secondary | ICD-10-CM

## 2020-11-07 ENCOUNTER — Encounter (INDEPENDENT_AMBULATORY_CARE_PROVIDER_SITE_OTHER): Payer: Self-pay | Admitting: Nurse Practitioner

## 2020-11-07 NOTE — Progress Notes (Signed)
Subjective:    Patient ID: Briana Ward, female    DOB: 1985/10/02, 35 y.o.   MRN: [id number] Chief Complaint  Patient presents with   New Patient (Initial Visit)    NP whitaker. Lymphedema    Briana Ward is a 35 year old female is seen for evaluation of leg swelling. The patient first noticed the swelling remotely but is now concerned because of a significant increase in the overall edema. The swelling is associated with pain and discoloration. The patient notes that in the morning the legs are significantly improved but they steadily worsened throughout the course of the day. Elevation makes the legs better, dependency makes them much worse.  The patient is very active daily based on her occupation.  She has also worn medical grade compression socks but has noted little improvement with use of compression.  There is no history of ulcerations associated with the swelling.    The patient has no had any past angiography, interventions or vascular surgery.  The patient denies a history of DVT or PE. There is no prior history of phlebitis. There is no history of primary lymphedema.  There is no history of radiation treatment to the groin or pelvis No history of malignancies. No history of trauma or groin or pelvic surgery. No history of foreign travel or parasitic infections area      Review of Systems  Cardiovascular:  Positive for leg swelling.  All other systems reviewed and are negative.     Objective:   Physical Exam Vitals reviewed.  HENT:     Head: Normocephalic.  Cardiovascular:     Rate and Rhythm: Normal rate.     Pulses: Normal pulses.  Pulmonary:     Effort: Pulmonary effort is normal.  Musculoskeletal:     Right lower leg: 1+ Edema present.     Left lower leg: 1+ Edema present.  Neurological:     Mental Status: She is alert and oriented to person, place, and time.  Psychiatric:        Mood and Affect: Mood normal.        Behavior: Behavior normal.         Thought Content: Thought content normal.        Judgment: Judgment normal.    BP 120/83   Pulse 82   Ht 5\' 4"  (1.626 m)   Wt 254 lb (115.2 kg)   BMI 43.60 kg/m   Past Medical History:  Diagnosis Date   Acne    Anxiety    Increased BMI    Irregular menses    Monilial vulvovaginitis    Seizure disorder (HCC)    Stroke [facility])     Social History   Socioeconomic History   Marital status: Single    Spouse name: Not on file   Number of children: Not on file   Years of education: Not on file   Highest education level: Not on file  Occupational History   Not on file  Tobacco Use   Smoking status: Never   Smokeless tobacco: Never  Substance and Sexual Activity   Alcohol use: No   Drug use: No   Sexual activity: Yes    Birth control/protection: I.U.D.  Other Topics Concern   Not on file  Social History Narrative   Not on file   Social Determinants of Health   Financial Resource Strain: Not on file  Food Insecurity: Not on file  Transportation Needs: Not on file  Physical Activity: Not on  file  Stress: Not on file  Social Connections: Not on file  Intimate Partner Violence: Not on file    Past Surgical History:  Procedure Laterality Date   CESAREAN SECTION     CHOLECYSTECTOMY     TONSILLECTOMY AND ADENOIDECTOMY      Family History  Problem Relation Age of Onset   Heart disease Mother    Diabetes Maternal Aunt    Breast cancer Maternal Aunt    Osteoporosis Maternal Grandmother    Colon cancer Neg Hx    Ovarian cancer Neg Hx     Allergies  Allergen Reactions   Lisinopril Cough   Penicillins     CBC Latest Ref Rng & Units 01/29/2018 01/13/2012 07/08/2011  WBC 3.6 - 11.0 K/uL 9.6 8.6 -  Hemoglobin 12.0 - 16.0 g/dL 41.9 37.9 -  Hematocrit 35.0 - 47.0 % 39.6 41.7 31.1(L)  Platelets 150 - 440 K/uL 295 230 -      CMP     Component Value Date/Time   NA 137 01/29/2018 1810   NA 137 01/13/2012 1659   K 3.8 01/29/2018 1810   K 3.8 01/13/2012 1659    CL 102 01/29/2018 1810   CL 103 01/13/2012 1659   CO2 19 (L) 01/29/2018 1810   CO2 30 01/13/2012 1659   GLUCOSE 102 (H) 01/29/2018 1810   GLUCOSE 85 01/13/2012 1659   BUN 14 01/29/2018 1810   BUN 11 01/13/2012 1659   CREATININE 0.91 01/29/2018 1810   CREATININE 0.62 01/13/2012 1659   CALCIUM 9.3 01/29/2018 1810   CALCIUM 9.4 01/13/2012 1659   PROT 7.7 01/13/2012 1659   ALBUMIN 3.9 01/13/2012 1659   AST 21 01/13/2012 1659   ALT 29 01/13/2012 1659   ALKPHOS 114 01/13/2012 1659   BILITOT 0.3 01/13/2012 1659   GFRNONAA >60 01/29/2018 1810   GFRNONAA >60 01/13/2012 1659   GFRAA >60 01/29/2018 1810   GFRAA >60 01/13/2012 1659     No results found.     Assessment & Plan:   1. Leg swelling I have had a long discussion with the patient regarding swelling and why it  causes symptoms.  Patient will begin wearing graduated compression stockings class 1 (20-30 mmHg) on a daily basis a prescription was given. The patient will  beginning wearing the stockings first thing in the morning and removing them in the evening. The patient is instructed specifically not to sleep in the stockings.   In addition, behavioral modification will be initiated.  This will include frequent elevation, use of over the counter pain medications and exercise such as walking.  I have reviewed systemic causes for chronic edema such as liver, kidney and cardiac etiologies.  The patient denies problems with these organ systems.    Consideration for a lymph pump will also be made based upon the effectiveness of conservative therapy.  This would help to improve the edema control and prevent sequela such as ulcers and infections   Patient should undergo duplex ultrasound of the venous system to ensure that DVT or reflux is not present.  The patient will follow-up with me after the ultrasound.    2. Essential hypertension Continue antihypertensive medications as already ordered, these medications have been reviewed  and there are no changes at this time.    Current Outpatient Medications on File Prior to Visit  Medication Sig Dispense Refill   escitalopram (LEXAPRO) 10 MG tablet TAKE 1 TABLET BY MOUTH ONCE DAILY. TAKE WITH 20 MG TABLET FOR A TOTAL  30 MG 90 tablet 3   escitalopram (LEXAPRO) 20 MG tablet TAKE 1 TABLET BY MOUTH ONCE DAILY WITH 10 MG TABLET FOR A TOTAL OF 30 MG DAILY 90 tablet 3   Insulin Pen Needle (UNIFINE PENTIPS) 31G X 5 MM MISC Use as directed with Saxenda 100 each 0   lamoTRIgine (LAMICTAL) 100 MG tablet   8   lamoTRIgine (LAMICTAL) 200 MG tablet TAKE 2 TABLETS (400 MG) BY MOUTH 2 TIMES DAILY 360 tablet 3   levonorgestrel (MIRENA) 20 MCG/24HR IUD 1 each by Intrauterine route once.     Liraglutide -Weight Management (SAXENDA) 18 MG/3ML SOPN Inject 0.6 mg once daily for 7 days, THEN 1.2 mg daily for 7 days, THEN 1.8 mg  daily for 7 days, THEN 2.4 mg daily for 7 days, THEN 3 mg daily for 7 days. 15 mL 0   losartan (COZAAR) 25 MG tablet TAKE 1 TABLET BY MOUTH ONCE DAILY 30 tablet 11   propranolol (INDERAL) 20 MG tablet TAKE 1 TABLET BY MOUTH TWICE DAILY 60 tablet 11   busPIRone (BUSPAR) 7.5 MG tablet TAKE 1 TABLET BY MOUTH 3 TIMES DAILY (Patient not taking: Reported on 10/26/2020) 90 tablet 11   escitalopram (LEXAPRO) 20 MG tablet TAKE 1 TABLET BY MOUTH ONCE DAILY WITH 10 MG TABLET FOR A TOTAL OF 30 MG DAILY (Patient not taking: Reported on 10/26/2020) 90 tablet 3   levETIRAcetam (KEPPRA) 750 MG tablet Take 750 mg by mouth 2 (two) times daily.     No current facility-administered medications on file prior to visit.    There are no Patient Instructions on file for this visit. No follow-ups on file.   Georgiana Spinner, NP

## 2020-11-29 ENCOUNTER — Other Ambulatory Visit (INDEPENDENT_AMBULATORY_CARE_PROVIDER_SITE_OTHER): Payer: Self-pay | Admitting: Nurse Practitioner

## 2020-11-29 DIAGNOSIS — M7989 Other specified soft tissue disorders: Secondary | ICD-10-CM

## 2020-11-30 ENCOUNTER — Other Ambulatory Visit: Payer: Self-pay

## 2020-11-30 ENCOUNTER — Encounter (INDEPENDENT_AMBULATORY_CARE_PROVIDER_SITE_OTHER): Payer: Self-pay | Admitting: Vascular Surgery

## 2020-11-30 ENCOUNTER — Ambulatory Visit (INDEPENDENT_AMBULATORY_CARE_PROVIDER_SITE_OTHER): Payer: No Typology Code available for payment source | Admitting: Vascular Surgery

## 2020-11-30 ENCOUNTER — Ambulatory Visit (INDEPENDENT_AMBULATORY_CARE_PROVIDER_SITE_OTHER): Payer: No Typology Code available for payment source

## 2020-11-30 VITALS — BP 127/88 | HR 79 | Resp 16 | Wt 251.6 lb

## 2020-11-30 DIAGNOSIS — R600 Localized edema: Secondary | ICD-10-CM

## 2020-11-30 DIAGNOSIS — I10 Essential (primary) hypertension: Secondary | ICD-10-CM | POA: Diagnosis not present

## 2020-11-30 DIAGNOSIS — I8310 Varicose veins of unspecified lower extremity with inflammation: Secondary | ICD-10-CM | POA: Diagnosis not present

## 2020-11-30 DIAGNOSIS — I890 Lymphedema, not elsewhere classified: Secondary | ICD-10-CM

## 2020-11-30 DIAGNOSIS — I872 Venous insufficiency (chronic) (peripheral): Secondary | ICD-10-CM | POA: Diagnosis not present

## 2020-11-30 DIAGNOSIS — M7989 Other specified soft tissue disorders: Secondary | ICD-10-CM

## 2020-11-30 NOTE — Progress Notes (Signed)
MRN : [id number]  Briana Ward is a 35 y.o. (12-Jun-1985) female who presents with chief complaint of No chief complaint on file. Marland Kitchen  History of Present Illness:   The patient returns for followup evaluation 3 months after the initial visit. The patient continues to have pain and severe swelling in the lower extremities especially with dependency. The pain is lessened with elevation. Graduated compression stockings, Class I (20-30 mmHg), have been worn but the stockings do not eliminate the leg pain or improve the swelling. Over-the-counter analgesics do not improve the symptoms. The degree of discomfort continues to interfere with daily activities. The patient notes the pain in the legs is causing problems with daily exercise, at the workplace and even with household activities and maintenance such as standing in the kitchen preparing meals and doing dishes.   Venous ultrasound shows normal deep venous system, no evidence of acute or chronic DVT.  Superficial reflux is present in the bilateral GSV   No outpatient medications have been marked as taking for the 11/30/20 encounter (Appointment) with Gilda Crease, Latina Craver, MD.    Past Medical History:  Diagnosis Date   Acne    Anxiety    Increased BMI    Irregular menses    Monilial vulvovaginitis    Seizure disorder (HCC)    Stroke [facility])     Past Surgical History:  Procedure Laterality Date   CESAREAN SECTION     CHOLECYSTECTOMY     TONSILLECTOMY AND ADENOIDECTOMY      Social History Social History   Tobacco Use   Smoking status: Never   Smokeless tobacco: Never  Substance Use Topics   Alcohol use: No   Drug use: No    Family History Family History  Problem Relation Age of Onset   Heart disease Mother    Diabetes Maternal Aunt    Breast cancer Maternal Aunt    Osteoporosis Maternal Grandmother    Colon cancer Neg Hx    Ovarian cancer Neg Hx     Allergies  Allergen Reactions   Lisinopril Cough   Penicillins       REVIEW OF SYSTEMS (Negative unless checked)  Constitutional: [] Weight loss  [] Fever  [] Chills Cardiac: [] Chest pain   [] Chest pressure   [] Palpitations   [] Shortness of breath when laying flat   [] Shortness of breath with exertion. Vascular:  [] Pain in legs with walking   [x] Pain in legs at rest  [] History of DVT   [] Phlebitis   [x] Swelling in legs   [x] Varicose veins   [] Non-healing ulcers Pulmonary:   [] Uses home oxygen   [] Productive cough   [] Hemoptysis   [] Wheeze  [] COPD   [] Asthma Neurologic:  [] Dizziness   [] Seizures   [] History of stroke   [] History of TIA  [] Aphasia   [] Vissual changes   [] Weakness or numbness in arm   [] Weakness or numbness in leg Musculoskeletal:   [] Joint swelling   [] Joint pain   [] Low back pain Hematologic:  [] Easy bruising  [] Easy bleeding   [] Hypercoagulable state   [] Anemic Gastrointestinal:  [] Diarrhea   [] Vomiting  [] Gastroesophageal reflux/heartburn   [] Difficulty swallowing. Genitourinary:  [] Chronic kidney disease   [] Difficult urination  [] Frequent urination   [] Blood in urine Skin:  [] Rashes   [] Ulcers  Psychological:  [] History of anxiety   []  History of major depression.  Physical Examination  There were no vitals filed for this visit. There is no height or weight on file to calculate BMI. Gen: WD/WN, NAD Head:  [place]/AT, No temporalis wasting.  Ear/Nose/Throat: Hearing grossly intact, nares w/o erythema or drainage Eyes: PER, EOMI, sclera nonicteric.  Neck: Supple, no large masses.   Pulmonary:  Good air movement, no audible wheezing bilaterally, no use of accessory muscles.  Cardiac: RRR, no JVD Vascular:   scattered varicosities present bilaterally.  Mild venous stasis changes to the legs bilaterally.  3+ soft pitting edema  Vessel Right Left  Radial Palpable Palpable  PT Palpable Palpable  DP Palpable Palpable  Gastrointestinal: Non-distended. No guarding/no peritoneal signs.  Musculoskeletal: M/S 5/5 throughout.  No deformity or  atrophy.  Neurologic: CN 2-12 intact. Symmetrical.  Speech is fluent. Motor exam as listed above. Psychiatric: Judgment intact, Mood & affect appropriate for pt's clinical situation. Dermatologic: Venous rashes no ulcers noted.  No changes consistent with cellulitis. Lymph : No lichenification or skin changes of chronic lymphedema.  CBC Lab Results  Component Value Date   WBC 9.6 01/29/2018   HGB 13.8 01/29/2018   HCT 39.6 01/29/2018   MCV 86.4 01/29/2018   PLT 295 01/29/2018    BMET    Component Value Date/Time   NA 137 01/29/2018 1810   NA 137 01/13/2012 1659   K 3.8 01/29/2018 1810   K 3.8 01/13/2012 1659   CL 102 01/29/2018 1810   CL 103 01/13/2012 1659   CO2 19 (L) 01/29/2018 1810   CO2 30 01/13/2012 1659   GLUCOSE 102 (H) 01/29/2018 1810   GLUCOSE 85 01/13/2012 1659   BUN 14 01/29/2018 1810   BUN 11 01/13/2012 1659   CREATININE 0.91 01/29/2018 1810   CREATININE 0.62 01/13/2012 1659   CALCIUM 9.3 01/29/2018 1810   CALCIUM 9.4 01/13/2012 1659   GFRNONAA >60 01/29/2018 1810   GFRNONAA >60 01/13/2012 1659   GFRAA >60 01/29/2018 1810   GFRAA >60 01/13/2012 1659   CrCl cannot be calculated (Patient's most recent lab result is older than the maximum 21 days allowed.).  COAG Lab Results  Component Value Date   INR 0.9 01/13/2012    Radiology No results found.    Assessment/Plan 1. Lymphedema Recommend:  No surgery or intervention at this point in time.    I have reviewed my previous discussion with the patient regarding swelling and why it causes symptoms.  Patient will continue wearing graduated compression stockings class 1 (20-30 mmHg) on a daily basis. The patient will  beginning wearing the stockings first thing in the morning and removing them in the evening. The patient is instructed specifically not to sleep in the stockings.    In addition, behavioral modification including several periods of elevation of the lower extremities during the day will be  continued.  This was reviewed with the patient during the initial visit.  The patient will also continue routine exercise, especially walking on a daily basis as was discussed during the initial visit.    Despite conservative treatments including graduated compression therapy class 1 and behavioral modification including exercise and elevation the patient  has not obtained adequate control of the lymphedema.  The patient still has stage 3 lymphedema and therefore, I believe that a lymph pump should be added to improve the control of the patient's lymphedema.  Additionally, a lymph pump is warranted because it will reduce the risk of cellulitis and ulceration in the future.  Patient should follow-up in six months    2. Varicose veins with inflammation Recommend  I have reviewed my previous  discussion with the patient regarding  varicose veins and why  they cause symptoms. Patient will continue  wearing graduated compression stockings class 1 on a daily basis, beginning first thing in the morning and removing them in the evening.    In addition, behavioral modification including elevation during the day was again discussed and this will continue.  The patient has utilized over the counter pain medications and has been exercising.  However, at this time conservative therapy has not alleviated the patient's symptoms of leg pain and swelling  Recommend: laser ablation of the right and  left great saphenous veins to eliminate the symptoms of pain and swelling of the lower extremities caused by the severe superficial venous reflux disease.    3. Chronic venous insufficiency Recommend  I have reviewed my previous  discussion with the patient regarding  varicose veins and why they cause symptoms. Patient will continue  wearing graduated compression stockings class 1 on a daily basis, beginning first thing in the morning and removing them in the evening.    In addition, behavioral modification  including elevation during the day was again discussed and this will continue.  The patient has utilized over the counter pain medications and has been exercising.  However, at this time conservative therapy has not alleviated the patient's symptoms of leg pain and swelling  Recommend: laser ablation of the right and  left great saphenous veins to eliminate the symptoms of pain and swelling of the lower extremities caused by the severe superficial venous reflux disease.    4. Essential hypertension Continue antihypertensive medications as already ordered, these medications have been reviewed and there are no changes at this time.     Levora Dredge, MD  11/30/2020 9:06 AM

## 2020-12-03 ENCOUNTER — Encounter (INDEPENDENT_AMBULATORY_CARE_PROVIDER_SITE_OTHER): Payer: Self-pay | Admitting: Vascular Surgery

## 2020-12-03 DIAGNOSIS — I890 Lymphedema, not elsewhere classified: Secondary | ICD-10-CM | POA: Insufficient documentation

## 2020-12-05 ENCOUNTER — Other Ambulatory Visit: Payer: Self-pay

## 2020-12-05 MED FILL — Buspirone HCl Tab 7.5 MG: ORAL | 30 days supply | Qty: 90 | Fill #1 | Status: CN

## 2020-12-05 MED FILL — Losartan Potassium Tab 25 MG: ORAL | 90 days supply | Qty: 90 | Fill #1 | Status: AC

## 2020-12-05 MED FILL — Propranolol HCl Tab 20 MG: ORAL | 30 days supply | Qty: 60 | Fill #1 | Status: AC

## 2020-12-18 ENCOUNTER — Other Ambulatory Visit: Payer: Self-pay

## 2020-12-19 ENCOUNTER — Other Ambulatory Visit: Payer: Self-pay

## 2020-12-19 MED ORDER — SAXENDA 18 MG/3ML ~~LOC~~ SOPN
PEN_INJECTOR | SUBCUTANEOUS | 5 refills | Status: DC
  Filled 2020-12-19: qty 15, 30d supply, fill #0
  Filled 2021-01-23: qty 15, 30d supply, fill #1
  Filled 2021-03-04 – 2021-03-07 (×3): qty 15, 30d supply, fill #2

## 2020-12-19 MED FILL — Lamotrigine Tab 200 MG: ORAL | 90 days supply | Qty: 360 | Fill #0 | Status: AC

## 2021-01-23 ENCOUNTER — Other Ambulatory Visit: Payer: Self-pay

## 2021-01-23 MED ORDER — UNIFINE PENTIPS 31G X 8 MM MISC
0 refills | Status: DC
  Filled 2021-01-23: qty 100, 90d supply, fill #0

## 2021-01-23 MED FILL — Propranolol HCl Tab 20 MG: ORAL | 30 days supply | Qty: 60 | Fill #2 | Status: AC

## 2021-02-06 ENCOUNTER — Other Ambulatory Visit: Payer: Self-pay

## 2021-02-06 MED FILL — Buspirone HCl Tab 7.5 MG: ORAL | 30 days supply | Qty: 90 | Fill #1 | Status: AC

## 2021-02-06 MED FILL — Escitalopram Oxalate Tab 10 MG (Base Equiv): ORAL | 90 days supply | Qty: 90 | Fill #1 | Status: AC

## 2021-03-04 MED FILL — Buspirone HCl Tab 7.5 MG: ORAL | 30 days supply | Qty: 90 | Fill #2 | Status: AC

## 2021-03-04 MED FILL — Losartan Potassium Tab 25 MG: ORAL | 90 days supply | Qty: 90 | Fill #2 | Status: AC

## 2021-03-05 ENCOUNTER — Other Ambulatory Visit: Payer: Self-pay

## 2021-03-07 ENCOUNTER — Other Ambulatory Visit: Payer: Self-pay

## 2021-03-15 ENCOUNTER — Other Ambulatory Visit: Payer: Self-pay | Admitting: Internal Medicine

## 2021-03-15 ENCOUNTER — Other Ambulatory Visit: Payer: Self-pay

## 2021-03-15 MED ORDER — PREDNISONE 10 MG PO TABS
ORAL_TABLET | ORAL | 0 refills | Status: DC
  Filled 2021-03-15: qty 21, 6d supply, fill #0

## 2021-03-21 ENCOUNTER — Telehealth (INDEPENDENT_AMBULATORY_CARE_PROVIDER_SITE_OTHER): Payer: Self-pay

## 2021-03-21 NOTE — Telephone Encounter (Signed)
This was received from Sherlyn Lick at [facility]:  MRN [id number]- this patient has never answered our calls nor returned our messages, I left a note on their door.

## 2021-03-22 ENCOUNTER — Other Ambulatory Visit: Payer: Self-pay

## 2021-03-22 MED ORDER — SAXENDA 18 MG/3ML ~~LOC~~ SOPN
PEN_INJECTOR | SUBCUTANEOUS | 5 refills | Status: DC
Start: 2021-03-22 — End: 2021-07-29
  Filled 2021-03-22 – 2021-03-28 (×3): qty 15, 30d supply, fill #0
  Filled 2021-05-15: qty 15, 30d supply, fill #1
  Filled 2021-06-22: qty 15, 30d supply, fill #2

## 2021-03-23 ENCOUNTER — Other Ambulatory Visit: Payer: Self-pay

## 2021-03-28 ENCOUNTER — Other Ambulatory Visit: Payer: Self-pay

## 2021-04-04 ENCOUNTER — Other Ambulatory Visit: Payer: Self-pay

## 2021-04-04 MED ORDER — QUETIAPINE FUMARATE 50 MG PO TABS
ORAL_TABLET | ORAL | 5 refills | Status: DC
  Filled 2021-04-04: qty 30, 30d supply, fill #0
  Filled 2021-08-14: qty 30, 30d supply, fill #1

## 2021-05-15 ENCOUNTER — Other Ambulatory Visit: Payer: Self-pay

## 2021-06-01 ENCOUNTER — Other Ambulatory Visit: Payer: Self-pay

## 2021-06-01 MED ORDER — LAGEVRIO 200 MG PO CAPS
ORAL_CAPSULE | ORAL | 0 refills | Status: DC
  Filled 2021-06-01: qty 40, 5d supply, fill #0

## 2021-06-01 MED ORDER — PREDNISONE 20 MG PO TABS
ORAL_TABLET | ORAL | 0 refills | Status: DC
  Filled 2021-06-01: qty 10, 5d supply, fill #0

## 2021-06-01 MED ORDER — BENZONATATE 200 MG PO CAPS
ORAL_CAPSULE | ORAL | 0 refills | Status: DC
  Filled 2021-06-01: qty 20, 7d supply, fill #0

## 2021-06-22 ENCOUNTER — Other Ambulatory Visit: Payer: Self-pay

## 2021-06-22 MED ORDER — LOSARTAN POTASSIUM 25 MG PO TABS
25.0000 mg | ORAL_TABLET | Freq: Every day | ORAL | 11 refills | Status: DC
  Filled 2021-06-22 – 2021-06-25 (×2): qty 90, 90d supply, fill #0

## 2021-06-25 ENCOUNTER — Other Ambulatory Visit: Payer: Self-pay

## 2021-06-26 ENCOUNTER — Other Ambulatory Visit: Payer: Self-pay

## 2021-07-11 ENCOUNTER — Other Ambulatory Visit: Payer: Self-pay | Admitting: Internal Medicine

## 2021-07-11 ENCOUNTER — Other Ambulatory Visit: Payer: Self-pay

## 2021-07-11 MED ORDER — AZITHROMYCIN 250 MG PO TABS
ORAL_TABLET | ORAL | 0 refills | Status: DC
  Filled 2021-07-11: qty 6, 5d supply, fill #0

## 2021-07-12 ENCOUNTER — Other Ambulatory Visit (HOSPITAL_COMMUNITY)
Admission: RE | Admit: 2021-07-12 | Discharge: 2021-07-12 | Disposition: A | Discharge status: Home / Self Care | Payer: No Typology Code available for payment source | Source: Ambulatory Visit | Attending: Internal Medicine | Admitting: Internal Medicine

## 2021-07-12 ENCOUNTER — Other Ambulatory Visit: Payer: Self-pay | Admitting: Internal Medicine

## 2021-07-12 DIAGNOSIS — Z113 Encounter for screening for infections with a predominantly sexual mode of transmission: Secondary | ICD-10-CM | POA: Diagnosis not present

## 2021-07-16 LAB — URINE CYTOLOGY ANCILLARY ONLY
Bacterial Vaginitis-Urine: NEGATIVE
Candida Urine: NEGATIVE
Chlamydia: NEGATIVE
Comment: NEGATIVE
Comment: NEGATIVE
Comment: NORMAL
Neisseria Gonorrhea: NEGATIVE
Trichomonas: NEGATIVE

## 2021-07-18 ENCOUNTER — Other Ambulatory Visit: Payer: Self-pay

## 2021-07-18 MED ORDER — LAMOTRIGINE 200 MG PO TABS
ORAL_TABLET | ORAL | 3 refills | Status: DC
  Filled 2021-07-18: qty 360, 90d supply, fill #0
  Filled 2021-12-17: qty 360, 90d supply, fill #1
  Filled 2022-05-10: qty 360, 90d supply, fill #2

## 2021-07-20 ENCOUNTER — Other Ambulatory Visit: Payer: Self-pay

## 2021-07-20 MED ORDER — UNIFINE PENTIPS 31G X 8 MM MISC
0 refills | Status: DC
  Filled 2021-07-20: qty 100, 90d supply, fill #0

## 2021-07-29 DIAGNOSIS — F411 Generalized anxiety disorder: Secondary | ICD-10-CM | POA: Insufficient documentation

## 2021-07-29 DIAGNOSIS — D519 Vitamin B12 deficiency anemia, unspecified: Secondary | ICD-10-CM | POA: Insufficient documentation

## 2021-07-29 DIAGNOSIS — E7800 Pure hypercholesterolemia, unspecified: Secondary | ICD-10-CM | POA: Insufficient documentation

## 2021-07-29 DIAGNOSIS — Z6740 Type O blood, Rh positive: Secondary | ICD-10-CM | POA: Insufficient documentation

## 2021-07-29 DIAGNOSIS — E559 Vitamin D deficiency, unspecified: Secondary | ICD-10-CM | POA: Insufficient documentation

## 2021-07-29 DIAGNOSIS — E538 Deficiency of other specified B group vitamins: Secondary | ICD-10-CM | POA: Insufficient documentation

## 2021-07-29 NOTE — Patient Instructions (Signed)
Seizure, Adult °A seizure is a sudden burst of abnormal electrical and chemical activity in the brain. Seizures usually last from 30 seconds to 2 minutes.  °What are the causes? °Common causes of this condition include: °Fever or infection. °Problems that affect the brain. These may include: °A brain or head injury. °Bleeding in the brain. °A brain tumor. °Low levels of blood sugar or salt. °Kidney problems or liver problems. °Conditions that are passed from parent to child (are inherited). °Problems with a substance, such as: °Having a reaction to a drug or a medicine. °Stopping the use of a substance all of a sudden (withdrawal). °A stroke. °Disorders that affect how you develop. °Sometimes, the cause may not be known.  °What increases the risk? °Having someone in your family who has epilepsy. In this condition, seizures happen again and again over time. They have no clear cause. °Having had a tonic-clonic seizure before. This type of seizure causes you to: °Tighten the muscles of the whole body. °Lose consciousness. °Having had a head injury or strokes before. °Having had a lack of oxygen at birth. °What are the signs or symptoms? °There are many types of seizures. The symptoms vary depending on the type of seizure you have. °Symptoms during a seizure °Shaking that you cannot control (convulsions) with fast, jerky movements of muscles. °Stiffness of the body. °Breathing problems. °Feeling mixed up (confused). °Staring or not responding to sound or touch. °Head nodding. °Eyes that blink, flutter, or move fast. °Drooling, grunting, or making clicking sounds with your mouth °Losing control of when you pee or poop. °Symptoms before a seizure °Feeling afraid, nervous, or worried. °Feeling like you may vomit. °Feeling like: °You are moving when you are not. °Things around you are moving when they are not. °Feeling like you saw or heard something before (déjà vu). °Odd tastes or smells. °Changes in how you see. You may  see flashing lights or spots. °Symptoms after a seizure °Feeling confused. °Feeling sleepy. °Headache. °Sore muscles. °How is this treated? °If your seizure stops on its own, you will not need treatment. If your seizure lasts longer than 5 minutes, you will normally need treatment. Treatment may include: °Medicines given through an IV tube. °Avoiding things, such as medicines, that are known to cause your seizures. °Medicines to prevent seizures. °A device to prevent or control seizures. °Surgery. °A diet low in carbohydrates and high in fat (ketogenic diet). °Follow these instructions at home: °Medicines °Take over-the-counter and prescription medicines only as told by your doctor. °Avoid foods or drinks that may keep your medicine from working, such as alcohol. °Activity °Follow instructions about driving, swimming, or doing things that would be dangerous if you had another seizure. Wait until your doctor says it is safe for you to do these things. °If you live in the [place] ask your local department of motor vehicles when you can drive. °Get a lot of rest. °Teaching others ° °Teach friends and family what to do when you have a seizure. They should: °Help you get down to the ground. °Protect your head and body. °Loosen any clothing around your neck. °Turn you on your side. °Know whether or not you need emergency care. °Stay with you until you are better. °Also, tell them what not to do if you have a seizure. Tell them: °They should not hold you down. °They should not put anything in your mouth. °General instructions °Avoid anything that gives you seizures. °Keep a seizure diary. Write down: °What you remember   about each seizure. °What you think caused each seizure. °Keep all follow-up visits. °Contact a doctor if: °You have another seizure or seizures. Call the doctor each time you have a seizure. °The pattern of your seizures changes. °You keep having seizures with treatment. °You have symptoms of being sick or  having an infection. °You are not able to take your medicine. °Get help right away if: °You have any of these problems: °A seizure that lasts longer than 5 minutes. °Many seizures in a row and you do not feel better between seizures. °A seizure that makes it harder to breathe. °A seizure and you can no longer speak or use part of your body. °You do not wake up right after a seizure. °You get hurt during a seizure. °You feel confused or have pain right after a seizure. °These symptoms may be an emergency. Get help right away. Call your local emergency services (911 in the [place] °Do not wait to see if the symptoms will go away. °Do not drive yourself to the hospital. °Summary °A seizure is a sudden burst of abnormal electrical and chemical activity in the brain. Seizures normally last from 30 seconds to 2 minutes. °Causes of seizures include illness, injury to the head, low levels of blood sugar or salt, and certain conditions. °Most seizures will stop on their own in less than 5 minutes. Seizures that last longer than 5 minutes are a medical emergency and need treatment right away. °Many medicines are used to treat seizures. Take over-the-counter and prescription medicines only as told by your doctor. °This information is not intended to replace advice given to you by your health care provider. Make sure you discuss any questions you have with your health care provider. °Document Revised: 10/29/2019 Document Reviewed: 10/29/2019 °Elsevier Patient Education © 2022 [employer] ° °

## 2021-07-30 ENCOUNTER — Other Ambulatory Visit: Payer: Self-pay

## 2021-07-30 ENCOUNTER — Telehealth: Payer: No Typology Code available for payment source | Admitting: Physician Assistant

## 2021-07-30 DIAGNOSIS — L559 Sunburn, unspecified: Secondary | ICD-10-CM

## 2021-07-30 MED ORDER — SILVER SULFADIAZINE 1 % EX CREA
1.0000 "application " | TOPICAL_CREAM | Freq: Every day | CUTANEOUS | 0 refills | Status: DC
  Filled 2021-07-30: qty 50, 50d supply, fill #0

## 2021-07-30 NOTE — Addendum Note (Signed)
Addended by: Margaretann Loveless on: 07/30/2021 12:15 PM ? ? Modules accepted: Orders ? ?

## 2021-07-30 NOTE — Progress Notes (Signed)
E-Visit for Sunburn ? ?We are sorry that you are not feeling well.  Here is how we plan to help! ? ?Based on what you have shared with me, I'd like to share with you a treatment plan for sunburn.  ? ?Most sunburn is a first degree burn that turns the skin pink or red.  It can be painful to touch.  If you stayed in the sun for a prolonged period this might have progressed to a second degree burn with blistering! ? ?Usually the pain and swelling starts after about 4 hours, peaks at 24 hours and begins to improve after 48 hours or about 2 days. ? ?REMEMBER prolonged exposure to the sun increases your risk of skin cancer so use sunscreen before you go outside!  We will give you more information about sunscreen use later in your care plan. ? ?Your sunburn can be managed by self-care at home.  Please use the following care guide to manage your sunburn.  If you symptoms worsen, you have other questions or concerns, or you develop any of the warnings signs listed in your care plan you will need to seek a face to face visit with a provider without waiting! ? ?Home Care Advice for Treating Mild Sunburn: ? ?Take Ibuprofen (Advil, Motrin) for pain relief as soon as possible.  The adult dosage is up to 600 mg every 6 hours.  Starting within 6 hours of sun exposure may greatly reduce your discomfort.  If you cannot take Ibuprofen you may use Acetaminophen instead. ?Do not take Ibuprofen if you have stomach problems, kidney disease or are pregnant. ?Do not take Ibuprofen if you have been told by your doctor or pharmacist to avoid this class of drugs. ?Do not take Acetaminophen if you have liver disease. ?Read the package warnings on any medication that you take! ? ?2.  Use a steroid cream on the affected skin.  If you apply an over the counter steroid cream (Hydrocortisone) as soon as possible and repeat it three times a day it may reduce the pain and swelling.  Until you get the steroid cream you may start with a moistening cream  or aloe gel. ? ?3. For second or third degree sunburn with painful blistering, you can use an over the counter product Burn Jel Plus Pain Relieving Gel. Apply in a thick even layer over the affected area not more than 3 to 4 times daily If you need to cover the area to protect it from friction of clothing, you can use Moist Burn Pads such as Hydrogel Burn Pads, which are available over the counter. ? ?4.  Apply cool compresses to the burned areas several times a day. ? ?5.  Avoid soap on the sunburned areas. ? ?6.  Drink plenty of water.  It is easy to get dehydrated from prolong time in the sun. ? ?7.  For any broken blisters: ?Trim off the dead skin with fine scissors.  It is wise to clean the scissor with alcohol before use. ?Apply antibiotic ointments to the blister.  Apply twice a day for three days.  There are triple antibiotic ointments with topical pain relievers available at stores. ?Caution:leave intact blisters alone.  They are protecting the skin and will allow it to heal. ? ?8.  Taking Vitamin C orally may reduce sun damage to your skin.  Follow the Instructions on the bottle.  The recommended adult dosage is 2 grams. ? ?What to Expect: ? ?Pain usually stops after 2  or 3 days. ?Skin flaking and peeling usually occurs for 3-7 days after a sunburn. ? ?Call your provider if: ? ?You feel very weak or have difficulty standing. ?Blister develops on your face. ?You become sensitive to light because of eye pain. ?Your skin looks infected (red streaks, puss or worsening tenderness after 48 hours. ?You feel you should be seen. ? ?Preventing Sunburns: ? ?Apply 20-30 SPF sunscreen to your skin before going into the sun. ?Reapply every 2-4 hours or after sweating or swimming. ?Sunscreens protect from sunburns but do not completely prevent skin damage.  Nancy Fetter exposure still increases your risk of premature aging and skin cancers. ? ?Thank you for choosing an e-visit. ? ?Your e-visit answers were reviewed by a board  certified advanced clinical practitioner to complete your personal care plan. Depending upon the condition, your plan could have included both over the counter or prescription medications. ? ?Please review your pharmacy choice. Make sure the pharmacy is open so you can pick up prescription now. If there is a problem, you may contact your provider through [employer] and have the prescription routed to another pharmacy.  Your safety is important to [place]. If you have drug allergies check your prescription carefully.  ? ?For the next 24 hours you can use MyChart to ask questions about today's visit, request a non-urgent call back, or ask for a work or school excuse. ?You will get an email in the next two days asking about your experience. I hope that your e-visit has been valuable and will speed your recovery. ? ?I provided 5 minutes of non face-to-face time during this encounter for chart review and documentation.  ? ?

## 2021-08-01 ENCOUNTER — Ambulatory Visit (INDEPENDENT_AMBULATORY_CARE_PROVIDER_SITE_OTHER): Payer: No Typology Code available for payment source | Admitting: Nurse Practitioner

## 2021-08-01 ENCOUNTER — Other Ambulatory Visit: Payer: Self-pay

## 2021-08-01 ENCOUNTER — Encounter: Payer: Self-pay | Admitting: Nurse Practitioner

## 2021-08-01 VITALS — BP 121/76 | HR 89 | Temp 98.6°F | Ht 64.02 in | Wt 238.8 lb

## 2021-08-01 DIAGNOSIS — E538 Deficiency of other specified B group vitamins: Secondary | ICD-10-CM

## 2021-08-01 DIAGNOSIS — Z6841 Body Mass Index (BMI) 40.0 and over, adult: Secondary | ICD-10-CM

## 2021-08-01 DIAGNOSIS — E7800 Pure hypercholesterolemia, unspecified: Secondary | ICD-10-CM

## 2021-08-01 DIAGNOSIS — I890 Lymphedema, not elsewhere classified: Secondary | ICD-10-CM

## 2021-08-01 DIAGNOSIS — I872 Venous insufficiency (chronic) (peripheral): Secondary | ICD-10-CM

## 2021-08-01 DIAGNOSIS — Z7689 Persons encountering health services in other specified circumstances: Secondary | ICD-10-CM

## 2021-08-01 DIAGNOSIS — I10 Essential (primary) hypertension: Secondary | ICD-10-CM | POA: Diagnosis not present

## 2021-08-01 DIAGNOSIS — R41840 Attention and concentration deficit: Secondary | ICD-10-CM | POA: Insufficient documentation

## 2021-08-01 DIAGNOSIS — R569 Unspecified convulsions: Secondary | ICD-10-CM

## 2021-08-01 DIAGNOSIS — Z8673 Personal history of transient ischemic attack (TIA), and cerebral infarction without residual deficits: Secondary | ICD-10-CM

## 2021-08-01 DIAGNOSIS — F411 Generalized anxiety disorder: Secondary | ICD-10-CM

## 2021-08-01 DIAGNOSIS — L559 Sunburn, unspecified: Secondary | ICD-10-CM | POA: Insufficient documentation

## 2021-08-01 DIAGNOSIS — E559 Vitamin D deficiency, unspecified: Secondary | ICD-10-CM

## 2021-08-01 MED ORDER — WEGOVY 0.25 MG/0.5ML ~~LOC~~ SOAJ
0.2500 mg | SUBCUTANEOUS | 4 refills | Status: DC
  Filled 2021-08-01 – 2021-08-07 (×4): qty 2, 28d supply, fill #0

## 2021-08-01 MED ORDER — FLUCONAZOLE 150 MG PO TABS
150.0000 mg | ORAL_TABLET | Freq: Once | ORAL | 0 refills | Status: AC
  Filled 2021-08-01: qty 1, 1d supply, fill #0

## 2021-08-01 MED ORDER — SULFAMETHOXAZOLE-TRIMETHOPRIM 800-160 MG PO TABS
1.0000 | ORAL_TABLET | Freq: Two times a day (BID) | ORAL | 0 refills | Status: AC
  Filled 2021-08-01: qty 14, 7d supply, fill #0

## 2021-08-01 MED ORDER — MUPIROCIN 2 % EX OINT
1.0000 "application " | TOPICAL_OINTMENT | Freq: Two times a day (BID) | CUTANEOUS | 0 refills | Status: DC
  Filled 2021-08-01: qty 22, 10d supply, fill #0

## 2021-08-01 MED ORDER — PREDNISONE 20 MG PO TABS
40.0000 mg | ORAL_TABLET | Freq: Every day | ORAL | 0 refills | Status: AC
  Filled 2021-08-01: qty 10, 5d supply, fill #0

## 2021-08-01 NOTE — Assessment & Plan Note (Signed)
Noted on labs in 2021 -- plan on lipid panel at physical in 4 weeks.  Continue diet and exercise focus. ?

## 2021-08-01 NOTE — Assessment & Plan Note (Signed)
Chronic, ongoing.  Followed by neurology, continue this collaboration and current medication regimen as order by them.  Plan Lamictal level at physical. ?

## 2021-08-01 NOTE — Assessment & Plan Note (Signed)
BMI 40.97 with minimal benefit from [place] at this time.  Discussed a direct change to [facility], will change to this -- may notice more loss with this and will decrease amount of injections.  Have alerted staff to work on PA for this.  Recommended eating smaller high protein, low fat meals more frequently and exercising 30 mins a day 5 times a week with a goal of 10-15lb weight loss in the next 3 months. Patient voiced their understanding and motivation to adhere to these recommendations. ? ?

## 2021-08-01 NOTE — Assessment & Plan Note (Signed)
Chronic, ongoing.  Recommend she continue daily supplement and will plan to recheck this at physical. ?

## 2021-08-01 NOTE — Assessment & Plan Note (Signed)
Chronic, ongoing.  Recommend she start Vitamin D3 2000 units daily and will check level at physical. ?

## 2021-08-01 NOTE — Assessment & Plan Note (Signed)
Followed by neurology, continue this collaboration. 

## 2021-08-01 NOTE — Assessment & Plan Note (Signed)
Chronic, ongoing.  Followed by vascular, to be wearing compression hose daily and recent note recommended lymph pumps, but she has not received these.  Recommend return to vascular. ?

## 2021-08-01 NOTE — Assessment & Plan Note (Signed)
Chronic, stable with BP at goal today.  Recommend she monitor BP at least a few mornings a week at home and document.  DASH diet at home.  Continue current medication regimen and adjust as needed.  Labs in 4 weeks at physical.   ?

## 2021-08-01 NOTE — Assessment & Plan Note (Signed)
To bilateral legs R>L with underlying lymphedema and vascular issues.  Concern for infection.  Will start Bactrim DS 1 tablet BID for 7 days and Prednisone 40 MG daily for 5 days to assist with inflammation.  Mupirocin for blister areas + recommend applying betadine to blister area to help dry out.  Continue Silvadene.  If worsening return to office ASAP. ?

## 2021-08-01 NOTE — Assessment & Plan Note (Signed)
Referral to attention specialist to further assess, treat as needed. ?

## 2021-08-01 NOTE — Assessment & Plan Note (Signed)
Chronic, ongoing.  Followed by vascular, to be wearing compression hose daily and recent note recommended lymph pumps, but she has not received these.  Recommend return to vascular. ?

## 2021-08-01 NOTE — Progress Notes (Signed)
? ?New Patient Office Visit ? ?Subjective:  ?Patient ID: Briana Ward, female    DOB: [birth year]  Age: 36 y.o. MRN: [id number] ? ?CC:  ?Chief Complaint  ?Patient presents with  ? New Patient (Initial Visit)  ? ? ?HPI ?Briana Ward presents for new patient visit to establish care.  Introduced to Designer, jewellery role and practice setting.  All questions answered.  Discussed provider/patient relationship and expectations.  ? ?Is on Saxenda at this time for weight loss, started by previous PCP.  Does not feel it is helping as much anymore, she is thinking of starting of Optivia.  She is a stress eater at baseline.  She initially lost 40 pounds with Saxenda, but has not been able to get past this.  Her goal weight is 180 lbs. ? ?Followed by vascular for lymphedema (Stage 3) and chronic venous insufficiency, normal imaging with vascular.  To be wearing compression and using lymph pumps.  Often the right is worse then left.  Does not have lymph pump, does not wear compression hose.   ? ?HYPERTENSION ?Currently on Losartan which is beneficial.  Lisinopril caused cough. ?Hypertension status: controlled  ?Satisfied with current treatment? yes ?Duration of hypertension: chronic ?BP monitoring frequency:  a few times a month ?BP range:  ?BP medication side effects:  no ?Medication compliance: good compliance ?Aspirin: no ?Recurrent headaches: no ?Visual changes: no ?Palpitations: no ?Dyspnea: no ?Chest pain: no ?Lower extremity edema: at baseline ?Dizzy/lightheaded: no  ? ?SEIZURE DISORDER: ?Followed by neurology, last saw Dr. Manuella Ghazi 04/04/21 -- started her on Seroquel and stopped Buspar at that visit for insomnia and anxiety -- only using Seroquel as needed.  Only taking Lexapro for anxiety at this time.  Continues on Lamictal in morning and evening, no recent check of level, this was deferred recent visit.  She was noted to have a lower B12 level in past on labs, 257, and a lower level Vitamin D 23.8.  Currently only  taking B12 in morning and not taking Vitamin D.  ? ?Was to have referral for ADHD, but has not had placed. ? ?SUN BURN ?Currently on Silvadene, but this is not helping.  Happened on Saturday while at beach, was wearing SPF 47.   ?Duration: days ?Location: lower legs ?History of trauma in area: yes ?Pain: yes ?Quality: yes ?Severity: 7/10 ?Redness: yes ?Swelling: yes ?Oozing: no ?Status: fluctuating ?Treatments attempted: Silvadene   ? ?Past Medical History:  ?Diagnosis Date  ? Acne   ? Anxiety   ? Increased BMI   ? Irregular menses   ? Monilial vulvovaginitis   ? Seizure disorder ([facility])   ? Stroke [facility])   ? ? ?Past Surgical History:  ?Procedure Laterality Date  ? CESAREAN SECTION    ? CHOLECYSTECTOMY    ? TONSILLECTOMY AND ADENOIDECTOMY    ? ? ?Family History  ?Problem Relation Age of Onset  ? Heart disease Mother   ? Diabetes Maternal Aunt   ? Breast cancer Maternal Aunt   ? Osteoporosis Maternal Grandmother   ? Colon cancer Neg Hx   ? Ovarian cancer Neg Hx   ? ? ?Social History  ? ?Socioeconomic History  ? Marital status: Single  ?  Spouse name: Not on file  ? Number of children: Not on file  ? Years of education: Not on file  ? Highest education level: Not on file  ?Occupational History  ? Not on file  ?Tobacco Use  ? Smoking status: Never  ? Smokeless tobacco:  Never  ?Substance and Sexual Activity  ? Alcohol use: No  ? Drug use: No  ? Sexual activity: Yes  ?  Birth control/protection: I.U.D.  ?Other Topics Concern  ? Not on file  ?Social History Narrative  ? Not on file  ? ?Social Determinants of Health  ? ?Financial Resource Strain: Not on file  ?Food Insecurity: Not on file  ?Transportation Needs: No Transportation Needs  ? Lack of Transportation (Medical): No  ? Lack of Transportation (Non-Medical): No  ?Physical Activity: Insufficiently Active  ? Days of Exercise per Week: 3 days  ? Minutes of Exercise per Session: 20 min  ?Stress: Not on file  ?Social Connections: Not on file  ?Intimate Partner Violence: Not  At Risk  ? Fear of Current or Ex-Partner: No  ? Emotionally Abused: No  ? Physically Abused: No  ? Sexually Abused: No  ? ? ?ROS ?Review of Systems  ?Constitutional:  Negative for activity change, appetite change, diaphoresis, fatigue and fever.  ?Respiratory:  Negative for cough, chest tightness, shortness of breath and wheezing.   ?Cardiovascular:  Positive for leg swelling. Negative for chest pain and palpitations.  ?Gastrointestinal: Negative.   ?Skin:  Positive for color change and wound.  ?Neurological: Negative.   ?Psychiatric/Behavioral: Negative.    ? ?Objective:  ? ?Today's Vitals: BP 121/76   Pulse 89   Temp 98.6 ?F (37 ?C) (Oral)   Ht 5' 4.02" (1.626 m)   Wt 238 lb 12.8 oz (108.3 kg)   SpO2 96%   BMI 40.97 kg/m?  ? ?Physical Exam ?Vitals and nursing note reviewed.  ?Constitutional:   ?   General: She is awake. She is not in acute distress. ?   Appearance: She is well-developed and well-groomed. She is obese. She is not ill-appearing or toxic-appearing.  ?HENT:  ?   Head: Normocephalic.  ?   Right Ear: Hearing normal.  ?   Left Ear: Hearing normal.  ?Eyes:  ?   General: Lids are normal.     ?   Right eye: No discharge.     ?   Left eye: No discharge.  ?   Conjunctiva/sclera: Conjunctivae normal.  ?   Pupils: Pupils are equal, round, and reactive to light.  ?Neck:  ?   Thyroid: No thyromegaly.  ?   Vascular: No carotid bruit.  ?Cardiovascular:  ?   Rate and Rhythm: Normal rate and regular rhythm.  ?   Heart sounds: Normal heart sounds. No murmur heard. ?  No gallop.  ?Pulmonary:  ?   Effort: Pulmonary effort is normal. No accessory muscle usage or respiratory distress.  ?   Breath sounds: Normal breath sounds.  ?Abdominal:  ?   General: Bowel sounds are normal.  ?   Palpations: Abdomen is soft. There is no hepatomegaly or splenomegaly.  ?Musculoskeletal:  ?   Cervical back: Normal range of motion and neck supple.  ?   Right lower leg: 2+ Edema present.  ?   Left lower leg: 2+ Edema present.  ?Skin: ?    General: Skin is warm and dry.  ?   Capillary Refill: Capillary refill takes less than 2 seconds.  ?   Findings: Wound present.  ?   Comments: Bilateral lower legs with moderate erythema R>L.  Baseline edema present to bilateral lower legs.  Blister, approx 2 cm, to right lower leg.    ?Neurological:  ?   Mental Status: She is alert and oriented to person, place, and time.  ?  Deep Tendon Reflexes: Reflexes are normal and symmetric.  ?   Reflex Scores: ?     Brachioradialis reflexes are 2+ on the right side and 2+ on the left side. ?     Patellar reflexes are 2+ on the right side and 2+ on the left side. ?Psychiatric:     ?   Attention and Perception: Attention normal.     ?   Mood and Affect: Mood normal.     ?   Speech: Speech normal.     ?   Behavior: Behavior normal. Behavior is cooperative.     ?   Thought Content: Thought content normal.  ? ? ?Assessment & Plan:  ? ?Problem List Items Addressed This Visit   ? ?  ? Cardiovascular and Mediastinum  ? Chronic venous insufficiency  ?  Chronic, ongoing.  Followed by vascular, to be wearing compression hose daily and recent note recommended lymph pumps, but she has not received these.  Recommend return to vascular. ?  ?  ? Essential hypertension  ?  Chronic, stable with BP at goal today.  Recommend she monitor BP at least a few mornings a week at home and document.  DASH diet at home.  Continue current medication regimen and adjust as needed.  Labs in 4 weeks at physical.   ?  ?  ?  ? Musculoskeletal and Integument  ? Burn from the sun  ?  To bilateral legs R>L with underlying lymphedema and vascular issues.  Concern for infection.  Will start Bactrim DS 1 tablet BID for 7 days and Prednisone 40 MG daily for 5 days to assist with inflammation.  Mupirocin for blister areas + recommend applying betadine to blister area to help dry out.  Continue Silvadene.  If worsening return to office ASAP. ?  ?  ?  ? Other  ? B12 deficiency  ?  Chronic, ongoing.  Recommend she  continue daily supplement and will plan to recheck this at physical. ?  ?  ? Class 3 severe obesity due to excess calories without serious comorbidity with body mass index (BMI) of 40.0 to 44.9 in adult [facility]

## 2021-08-01 NOTE — Assessment & Plan Note (Signed)
Chronic, ongoing.  Currently on Lexapro and Seroquel by neurology.  Continue this and adjust as needed, wishes to lose weight -- may benefit from time off Seroquel in future to assist with weight loss.  Denies SI/HI. ?

## 2021-08-02 ENCOUNTER — Telehealth: Payer: Self-pay

## 2021-08-02 ENCOUNTER — Other Ambulatory Visit: Payer: Self-pay

## 2021-08-02 MED ORDER — HYDROCODONE-ACETAMINOPHEN 5-325 MG PO TABS
1.0000 | ORAL_TABLET | Freq: Four times a day (QID) | ORAL | 0 refills | Status: AC | PRN
  Filled 2021-08-02: qty 20, 5d supply, fill #0

## 2021-08-02 NOTE — Telephone Encounter (Signed)
Prior authorization initiated via CoverMyMeds for prescription for Wegovy 0.25 MG/0.5 ML. ? ?KEY: [id number] ?

## 2021-08-02 NOTE — Progress Notes (Signed)
PA started for Wegovy through Covermy meds. Awaiting on determination ? ?

## 2021-08-02 NOTE — Addendum Note (Signed)
Addended by: Aura Dials T on: 08/02/2021 10:43 AM ? ? Modules accepted: Orders ? ?

## 2021-08-02 NOTE — Telephone Encounter (Signed)
Briana Ward, received message from Ms. Briana Ward wanting to know if you would be willing to send in  something a little bit stronger. " I have hit my limit with the pain.Marland Kitchen to the point I am crying, especially since I hit it on the closet door and bout fell out today! Can you get a message to her about prescribing a higher dose of ibuprofen or something?? " ? ?Please and thank you ?

## 2021-08-06 ENCOUNTER — Other Ambulatory Visit: Payer: Self-pay

## 2021-08-07 ENCOUNTER — Other Ambulatory Visit: Payer: Self-pay

## 2021-08-14 ENCOUNTER — Other Ambulatory Visit: Payer: Self-pay

## 2021-08-22 ENCOUNTER — Other Ambulatory Visit: Payer: Self-pay

## 2021-08-22 MED ORDER — ALPRAZOLAM 0.5 MG PO TABS
ORAL_TABLET | ORAL | 0 refills | Status: DC
  Filled 2021-08-22: qty 2, 1d supply, fill #0

## 2021-08-22 MED ORDER — ALPRAZOLAM 0.5 MG PO TABS: ORAL_TABLET | ORAL | 0 refills | Status: DC

## 2021-08-23 ENCOUNTER — Encounter (INDEPENDENT_AMBULATORY_CARE_PROVIDER_SITE_OTHER): Payer: Self-pay | Admitting: Vascular Surgery

## 2021-08-23 ENCOUNTER — Ambulatory Visit (INDEPENDENT_AMBULATORY_CARE_PROVIDER_SITE_OTHER): Payer: No Typology Code available for payment source | Admitting: Vascular Surgery

## 2021-08-23 VITALS — BP 126/80 | HR 79 | Resp 16 | Wt 229.0 lb

## 2021-08-23 DIAGNOSIS — I8310 Varicose veins of unspecified lower extremity with inflammation: Secondary | ICD-10-CM | POA: Diagnosis not present

## 2021-08-23 NOTE — Progress Notes (Signed)
? ? ?  MRN : [id number] ? ?Briana Ward is a 36 y.o. (06-25-1985) female who presents with chief complaint of No chief complaint on file. ?. ? ? ? ?The patient's left lower extremity was sterilely prepped and draped.  The ultrasound machine was used to visualize the left great saphenous vein throughout its course.  A segment below the knee was selected for access.  The saphenous vein was accessed without difficulty using ultrasound guidance with a micropuncture needle.   An 0.018  wire was placed beyond the saphenofemoral junction through the sheath and the microneedle was removed.  The 65 cm sheath was then placed over the wire and the wire and dilator were removed.  The laser fiber was placed through the sheath and its tip was placed approximately 2 cm below the saphenofemoral junction.  Tumescent anesthesia was then created with a dilute lidocaine solution.  Laser energy was then delivered with constant withdrawal of the sheath and laser fiber.  Approximately 1583 Joules of energy were delivered over a length of 42 cm.  Sterile dressings were placed.  The patient tolerated the procedure well without complications.  ?

## 2021-08-26 NOTE — Patient Instructions (Signed)
Lymphedema ? ?Lymphedema is swelling that is caused by the abnormal collection of lymph in the tissues under the skin. Lymph is excess fluid from the tissues in your body that is removed through the lymphatic system. This system is part of your body's defense system (immune system) and includes lymph nodes and lymph vessels. The lymph vessels collect and carry the excess fluid, fats, proteins, and waste from the tissues of the body to the bloodstream. This system also works to clean and remove bacteria and waste products from the body. ?Lymphedema occurs when the lymphatic system is blocked. When the lymph vessels or lymph nodes are blocked or damaged, lymph does not drain properly. This causes an abnormal buildup of lymph, which leads to swelling in the affected area. This may include the trunk area, or an arm or leg. Lymphedema cannot be cured by medicines, but various methods can be used to help reduce the swelling. ?What are the causes? ?The cause of this condition depends on the type of lymphedema that you have. ?Primary lymphedema is caused by the absence of lymph vessels or having abnormal lymph vessels at birth. ?Secondary lymphedema occurs when lymph vessels are blocked or damaged. Secondary lymphedema is more common. Common causes of lymph vessel blockage include: ?Skin infection, such as cellulitis. ?Infection by parasites (filariasis). ?Injury. ?Radiation therapy. ?Cancer. ?Formation of scar tissue. ?Surgery. ?What are the signs or symptoms? ?Symptoms of this condition include: ?Swelling of the arm or leg. ?A heavy or tight feeling in the arm or leg. ?Swelling of the feet, toes, or fingers. Shoes or rings may fit more tightly than before. ?Redness of the skin over the affected area. ?Limited movement of the affected limb. ?Sensitivity to touch or discomfort in the affected limb. ?How is this diagnosed? ?This condition may be diagnosed based on: ?Your symptoms and medical history. ?A physical  exam. ?Bioimpedance spectroscopy. In this test, painless electrical currents are used to measure fluid levels in your body. ?Imaging tests, such as: ?MRI. ?CT scan. ?Duplex ultrasound. This test uses sound waves to produce images of the vessels and the blood flow on a screen. ?Lymphoscintigraphy. In this test, a low dose of a radioactive substance is injected to trace the flow of lymph through your lymph vessels. ?Lymphangiography. In this test, a contrast dye is injected into the lymph vessel to help show blockages. ?How is this treated? ? ?If an underlying condition is causing the lymphedema, that condition will be treated. For example, antibiotic medicines may be used to treat an infection. ?Treatment for this condition will depend on the cause of your lymphedema. Treatment may include: ?Complete decongestive therapy (CDT). This is done by a certified lymphedema therapist to reduce fluid congestion. This therapy includes: ?Skin care. ?Compression wrapping of the affected area. ?Manual lymph drainage. This is a special massage technique that promotes lymph drainage out of a limb. ?Specific exercises. Certain exercises can help fluid move out of the affected limb. ?Compression. Various methods may be used to apply pressure to the affected limb to reduce the swelling. They include: ?Wearing compression stockings or sleeves on the affected limb. ?Wrapping the affected limb with special bandages. ?Surgery. This is usually done for severe cases only. For example, surgery may be done if you have trouble moving the limb or if the swelling does not get better with other treatments. ?Follow these instructions at home: ?Self-care ?The affected area is more likely to become injured or infected. Take these steps to help prevent infection: ?Keep the affected   area clean and dry. ?Use approved creams or lotions to keep the skin moisturized. ?Protect your skin from cuts: ?Use gloves while cooking or gardening. ?Do not walk  barefoot. ?If you shave the affected area, use an electric razor. ?Do not wear tight clothes, shoes, or jewelry. ?Eat a healthy diet that includes a lot of fruits and vegetables. ?Activity ?Do exercises as told by your health care provider. ?Do not sit with your legs crossed. ?When possible, keep the affected limb raised (elevated) above the level of your heart. ?Avoid carrying things with an arm that is affected by lymphedema. ?General instructions ?Wear compression stockings or sleeves as told by your health care provider. ?Note any changes in size of the affected limb. You may be instructed to take regular measurements and keep track of them. ?Take over-the-counter and prescription medicines only as told by your health care provider. ?If you were prescribed an antibiotic medicine, take or apply it as told by your health care provider. Do not stop using the antibiotic even if you start to feel better or if your condition improves. ?Do not use heating pads or ice packs on the affected area. ?Avoid having blood draws, IV insertions, or blood pressure checks on the affected limb. ?Keep all follow-up visits. This is important. ?Contact a health care provider if you: ?Continue to have swelling in your limb. ?Have fluid leaking from the skin of your swollen limb. ?Have a cut that does not heal. ?Have redness or pain in the affected area. ?Develop purplish spots, rash, blisters, or sores (lesions) on your affected limb. ?Get help right away if you: ?Have new swelling in your limb that starts suddenly. ?Have shortness of breath or chest pain. ?Have a fever or chills. ?These symptoms may represent a serious problem that is an emergency. Do not wait to see if the symptoms will go away. Get medical help right away. Call your local emergency services (911 in the [place] Do not drive yourself to the hospital. ?Summary ?Lymphedema is swelling that is caused by the abnormal collection of lymph in the tissues under the  skin. ?Lymph is fluid from the tissues in your body that is removed through the lymphatic system. This system collects and carries excess fluid, fats, proteins, and wastes from the tissues of the body to the bloodstream. ?Lymphedema causes swelling, pain, and redness in the affected area. This may include the trunk area, or an arm or leg. ?Treatment for this condition may depend on the cause of your lymphedema. Treatment may include treating the underlying cause, complete decongestive therapy (CDT), compression methods, or surgery. ?This information is not intended to replace advice given to you by your health care provider. Make sure you discuss any questions you have with your health care provider. ?Document Revised: 02/16/2020 Document Reviewed: 02/16/2020 ?Elsevier Patient Education ? 2023 [employer] ? ?

## 2021-08-28 ENCOUNTER — Other Ambulatory Visit (INDEPENDENT_AMBULATORY_CARE_PROVIDER_SITE_OTHER): Payer: Self-pay | Admitting: Vascular Surgery

## 2021-08-28 DIAGNOSIS — I83819 Varicose veins of unspecified lower extremities with pain: Secondary | ICD-10-CM

## 2021-08-29 ENCOUNTER — Other Ambulatory Visit: Payer: Self-pay

## 2021-08-29 ENCOUNTER — Encounter: Payer: Self-pay | Admitting: Nurse Practitioner

## 2021-08-29 ENCOUNTER — Ambulatory Visit (INDEPENDENT_AMBULATORY_CARE_PROVIDER_SITE_OTHER): Payer: No Typology Code available for payment source | Admitting: Nurse Practitioner

## 2021-08-29 VITALS — BP 125/80 | HR 82 | Temp 98.7°F | Ht 64.0 in | Wt 234.8 lb

## 2021-08-29 DIAGNOSIS — I872 Venous insufficiency (chronic) (peripheral): Secondary | ICD-10-CM

## 2021-08-29 DIAGNOSIS — R569 Unspecified convulsions: Secondary | ICD-10-CM

## 2021-08-29 DIAGNOSIS — I10 Essential (primary) hypertension: Secondary | ICD-10-CM | POA: Diagnosis not present

## 2021-08-29 DIAGNOSIS — L559 Sunburn, unspecified: Secondary | ICD-10-CM

## 2021-08-29 DIAGNOSIS — E559 Vitamin D deficiency, unspecified: Secondary | ICD-10-CM

## 2021-08-29 DIAGNOSIS — Z1159 Encounter for screening for other viral diseases: Secondary | ICD-10-CM

## 2021-08-29 DIAGNOSIS — Z6841 Body Mass Index (BMI) 40.0 and over, adult: Secondary | ICD-10-CM

## 2021-08-29 DIAGNOSIS — Z0000 Encounter for general adult medical examination without abnormal findings: Secondary | ICD-10-CM

## 2021-08-29 DIAGNOSIS — R7301 Impaired fasting glucose: Secondary | ICD-10-CM

## 2021-08-29 DIAGNOSIS — F411 Generalized anxiety disorder: Secondary | ICD-10-CM

## 2021-08-29 DIAGNOSIS — I890 Lymphedema, not elsewhere classified: Secondary | ICD-10-CM

## 2021-08-29 DIAGNOSIS — E7800 Pure hypercholesterolemia, unspecified: Secondary | ICD-10-CM

## 2021-08-29 DIAGNOSIS — I8310 Varicose veins of unspecified lower extremity with inflammation: Secondary | ICD-10-CM

## 2021-08-29 DIAGNOSIS — E538 Deficiency of other specified B group vitamins: Secondary | ICD-10-CM

## 2021-08-29 DIAGNOSIS — Z124 Encounter for screening for malignant neoplasm of cervix: Secondary | ICD-10-CM

## 2021-08-29 LAB — BAYER DCA HB A1C WAIVED: HB A1C (BAYER DCA - WAIVED): 4.7 % — ABNORMAL LOW (ref 4.8–5.6)

## 2021-08-29 LAB — MICROALBUMIN, URINE WAIVED
Creatinine, Urine Waived: 200 mg/dL (ref 10–300)
Microalb, Ur Waived: 10 mg/L (ref 0–19)
Microalb/Creat Ratio: <30 mg/g (ref <30)

## 2021-08-29 MED ORDER — SERTRALINE HCL 50 MG PO TABS
50.0000 mg | ORAL_TABLET | Freq: Every day | ORAL | 3 refills | Status: DC
  Filled 2021-08-29: qty 90, 90d supply, fill #0

## 2021-08-29 MED ORDER — WEGOVY 0.5 MG/0.5ML ~~LOC~~ SOAJ
0.5000 mg | SUBCUTANEOUS | 4 refills | Status: DC
  Filled 2021-08-29: qty 2, 28d supply, fill #0
  Filled 2021-09-26 – 2021-10-10 (×2): qty 2, 28d supply, fill #1

## 2021-08-29 NOTE — Assessment & Plan Note (Signed)
Chronic, stable with BP at goal today.  Recommend she monitor BP at least a few mornings a week at home and document.  DASH diet at home.  Continue current medication regimen and adjust as needed.  Labs today: TSH, CBC, CMP, urine ALB (10 on check today-April 2023).   ?

## 2021-08-29 NOTE — Assessment & Plan Note (Signed)
BMI 40.30 and continues on Wegovy.  Tolerating well with no current loss, increase dosing of Wegovy to 0.5 MG weekly.  Recommended eating smaller high protein, low fat meals more frequently and exercising 30 mins a day 5 times a week with a goal of 10-15lb weight loss in the next 3 months. Patient voiced their understanding and motivation to adhere to these recommendations. ? ?

## 2021-08-29 NOTE — Assessment & Plan Note (Signed)
Chronic, ongoing.  Recommend she continue daily supplement and check level today. ?

## 2021-08-29 NOTE — Assessment & Plan Note (Signed)
Followed by vascular and receiving treatment at this time.  Continue this collaboration and adjust as needed. ?

## 2021-08-29 NOTE — Assessment & Plan Note (Addendum)
Chronic, ongoing.  Recommend she conitnue Vitamin D3 2000 units daily and check level today. ?

## 2021-08-29 NOTE — Progress Notes (Signed)
? ?BP 125/80   Pulse 82   Temp 98.7 ?F (37.1 ?C) (Oral)   Ht 5\' 4"  (1.626 m)   Wt 234 lb 12.8 oz (106.5 kg)   SpO2 99%   BMI 40.30 kg/m?   ? ?Subjective:  ? ? Patient ID: Briana Ward, female    DOB: 11-28-1985, 36 y.o.   MRN: [id number] ? ?HPI: ?Briana Blanksmily L Apostol is a 36 y.o. female presenting on 08/29/2021 for comprehensive medical examination. Current medical complaints include:none ? ?She currently lives with: friend ?Menopausal Symptoms: no ? ?HYPERTENSION ?Currently on Losartan which is beneficial.  Lisinopril caused cough. ? ?Has been seen by vascular -- 08/23/21, returns for ultrasound tomorrow, had injections recently with them.  Is taking Wegovy for weight loss, which is offering her benefit with decreased appetite.  No side effects noted with this. ?Hypertension status: controlled  ?Satisfied with current treatment? yes ?Duration of hypertension: chronic ?BP monitoring frequency:  a few times a month ?BP range:  ?BP medication side effects:  no ?Medication compliance: good compliance ?Aspirin: no ?Recurrent headaches: no ?Visual changes: no ?Palpitations: no ?Dyspnea: no ?Chest pain: no ?Lower extremity edema: at baseline ?Dizzy/lightheaded: no  ?  ?SEIZURE DISORDER: ?Followed by neurology, last saw Dr. Sherryll BurgerShah 04/04/21 -- placed on Seroquel and stopped Buspar at that visit for insomnia and anxiety -- only using Seroquel as needed.  Taking Lexapro for anxiety at this time.  Continues on Lamictal in morning and evening.  She was noted to have a lower B12 level in past on labs, 257, and a lower level Vitamin D 23.8.  Taking supplements. ? ?  08/29/2021  ?  3:36 PM 08/01/2021  ?  2:15 PM  ?Depression screen PHQ 2/9  ?Decreased Interest 0 0  ?Down, Depressed, Hopeless 1 1  ?PHQ - 2 Score 1 1  ?Altered sleeping 1 3  ?Tired, decreased energy 1 1  ?Change in appetite 0 0  ?Feeling bad or failure about yourself  2 1  ?Trouble concentrating 1 0  ?Moving slowly or fidgety/restless 0 0  ?Suicidal thoughts 0 0   ?PHQ-9 Score 6 6  ?Difficult doing work/chores  Not difficult at all  ? ? ?  08/29/2021  ?  3:36 PM  ?GAD 7 : Generalized Anxiety Score  ?Nervous, Anxious, on Edge 2  ?Control/stop worrying 1  ?Worry too much - different things 2  ?Trouble relaxing 1  ?Restless 0  ?Easily annoyed or irritable 3  ?Afraid - awful might happen 2  ?Total GAD 7 Score 11  ?Anxiety Difficulty Somewhat difficult  ? ?  ? ?  01/29/2018  ?  6:06 PM 08/31/2019  ? 10:11 PM 08/01/2021  ?  2:14 PM 08/29/2021  ?  4:01 PM  ?Fall Risk  ?Falls in the past year?   0 0  ?Was there an injury with Fall?   0 0  ?Fall Risk Category Calculator   0 0  ?Fall Risk Category   Low Low  ?Patient Fall Risk Level Low fall risk Low fall risk  Low fall risk  ?Patient at Risk for Falls Due to   No Fall Risks No Fall Risks  ?Fall risk Follow up   Falls evaluation completed Falls evaluation completed  ?  ?Functional Status Survey: ?Is the patient deaf or have difficulty hearing?: No ?Does the patient have difficulty seeing, even when wearing glasses/contacts?: No ?Does the patient have difficulty concentrating, remembering, or making decisions?: No ?Does the patient have difficulty walking or climbing  stairs?: No ?Does the patient have difficulty dressing or bathing?: No ?Does the patient have difficulty doing errands alone such as visiting a doctor's office or shopping?: No  ? ?Past Medical History:  ?Past Medical History:  ?Diagnosis Date  ? Acne   ? Anxiety   ? Increased BMI   ? Irregular menses   ? Monilial vulvovaginitis   ? Seizure disorder (HCC)   ? Stroke [facility])   ? ? ?Surgical History:  ?Past Surgical History:  ?Procedure Laterality Date  ? CESAREAN SECTION    ? CHOLECYSTECTOMY    ? TONSILLECTOMY AND ADENOIDECTOMY    ? ? ?Medications:  ?Current Outpatient Medications on File Prior to Visit  ?Medication Sig  ? ALPRAZolam (XANAX) 0.5 MG tablet Take 1 tablet by mouth 1 hour before procedure and 1 tablet when you arrive in office. For procedure #2 Sep 27, 2021  ?  lamoTRIgine (LAMICTAL) 200 MG tablet TAKE 2 TABLETS (400 MG) BY MOUTH 2 TIMES DAILY  ? levonorgestrel (MIRENA) 20 MCG/24HR IUD 1 each by Intrauterine route once.  ? losartan (COZAAR) 25 MG tablet TAKE 1 TABLET BY MOUTH ONCE DAILY  ? mupirocin ointment (BACTROBAN) 2 % Apply 1 application. topically 2 (two) times daily.  ? QUEtiapine (SEROQUEL) 50 MG tablet Take 1 tablet (50 mg total) by mouth at bedtime for 180 days  ? silver sulfADIAZINE (SILVADENE) 1 % cream Apply 1 application. topically daily.  ? ?No current facility-administered medications on file prior to visit.  ? ? ?Allergies:  ?Allergies  ?Allergen Reactions  ? Aloe Vera Cough  ? Lisinopril Cough  ? Penicillins   ? ? ?Social History:  ?Social History  ? ?Socioeconomic History  ? Marital status: Single  ?  Spouse name: Not on file  ? Number of children: Not on file  ? Years of education: Not on file  ? Highest education level: Not on file  ?Occupational History  ? Not on file  ?Tobacco Use  ? Smoking status: Never  ? Smokeless tobacco: Never  ?Substance and Sexual Activity  ? Alcohol use: No  ? Drug use: No  ? Sexual activity: Yes  ?  Birth control/protection: I.U.D.  ?Other Topics Concern  ? Not on file  ?Social History Narrative  ? Not on file  ? ?Social Determinants of Health  ? ?Financial Resource Strain: Not on file  ?Food Insecurity: Not on file  ?Transportation Needs: No Transportation Needs  ? Lack of Transportation (Medical): No  ? Lack of Transportation (Non-Medical): No  ?Physical Activity: Insufficiently Active  ? Days of Exercise per Week: 3 days  ? Minutes of Exercise per Session: 20 min  ?Stress: Not on file  ?Social Connections: Not on file  ?Intimate Partner Violence: Not At Risk  ? Fear of Current or Ex-Partner: No  ? Emotionally Abused: No  ? Physically Abused: No  ? Sexually Abused: No  ? ?Social History  ? ?Tobacco Use  ?Smoking Status Never  ?Smokeless Tobacco Never  ? ?Social History  ? ?Substance and Sexual Activity  ?Alcohol Use No   ? ? ?Family History:  ?Family History  ?Problem Relation Age of Onset  ? Heart disease Mother   ? Diabetes Maternal Aunt   ? Breast cancer Maternal Aunt   ? Osteoporosis Maternal Grandmother   ? Colon cancer Neg Hx   ? Ovarian cancer Neg Hx   ? ? ?Past medical history, surgical history, medications, allergies, family history and social history reviewed with patient today and changes  made to appropriate areas of the chart.  ? ?ROS ?All other ROS negative except what is listed above and in the HPI.  ? ?   ?Objective:  ?  ?BP 125/80   Pulse 82   Temp 98.7 ?F (37.1 ?C) (Oral)   Ht 5\' 4"  (1.626 m)   Wt 234 lb 12.8 oz (106.5 kg)   SpO2 99%   BMI 40.30 kg/m?   ?Wt Readings from Last 3 Encounters:  ?08/29/21 234 lb 12.8 oz (106.5 kg)  ?08/23/21 229 lb (103.9 kg)  ?08/01/21 238 lb 12.8 oz (108.3 kg)  ?  ?Physical Exam ?Vitals and nursing note reviewed.  ?Constitutional:   ?   General: She is awake. She is not in acute distress. ?   Appearance: She is well-developed. She is not ill-appearing.  ?HENT:  ?   Head: Normocephalic and atraumatic.  ?   Right Ear: Hearing, tympanic membrane, ear canal and external ear normal. No drainage.  ?   Left Ear: Hearing, tympanic membrane, ear canal and external ear normal. No drainage.  ?   Nose: Nose normal.  ?   Right Sinus: No maxillary sinus tenderness or frontal sinus tenderness.  ?   Left Sinus: No maxillary sinus tenderness or frontal sinus tenderness.  ?   Mouth/Throat:  ?   Mouth: Mucous membranes are moist.  ?   Pharynx: Oropharynx is clear. Uvula midline. No pharyngeal swelling, oropharyngeal exudate or posterior oropharyngeal erythema.  ?Eyes:  ?   General: Lids are normal.     ?   Right eye: No discharge.     ?   Left eye: No discharge.  ?   Extraocular Movements: Extraocular movements intact.  ?   Conjunctiva/sclera: Conjunctivae normal.  ?   Pupils: Pupils are equal, round, and reactive to light.  ?   Visual Fields: Right eye visual fields normal and left eye visual  fields normal.  ?Neck:  ?   Thyroid: No thyromegaly.  ?   Vascular: No carotid bruit.  ?   Trachea: Trachea normal.  ?Cardiovascular:  ?   Rate and Rhythm: Normal rate and regular rhythm.  ?   Heart sounds: Normal heart sounds.

## 2021-08-29 NOTE — Assessment & Plan Note (Signed)
Chronic, ongoing.  Followed by vascular, to be wearing compression hose daily. ?

## 2021-08-29 NOTE — Assessment & Plan Note (Addendum)
Chronic, ongoing.  Followed by neurology, continue this collaboration and current medication regimen as ordered by them.  Labs today. ?

## 2021-08-29 NOTE — Assessment & Plan Note (Signed)
Chronic, ongoing.  Followed by vascular, to be wearing compression hose daily and recent note recommended lymph pumps.  Continue collaboration with them. ?

## 2021-08-29 NOTE — Assessment & Plan Note (Signed)
Chronic, ongoing.  Currently on Lexapro without benefit, her mom takes Zoloft and has benefit from this.  Will stop Lexapro and do direct switch to Zoloft, starting at 50 MG daily.  Continue this and adjust as needed, wishes to lose weight -- may benefit from time off Seroquel in future to assist with weight loss -- Seroquel ordered by neuro.  Denies SI/HI. ?

## 2021-08-29 NOTE — Assessment & Plan Note (Signed)
Noted on labs in 2021 -- lipid panel on labs today.  Continue diet and exercise focus. ?

## 2021-08-30 ENCOUNTER — Ambulatory Visit (INDEPENDENT_AMBULATORY_CARE_PROVIDER_SITE_OTHER): Payer: No Typology Code available for payment source

## 2021-08-30 DIAGNOSIS — I83819 Varicose veins of unspecified lower extremities with pain: Secondary | ICD-10-CM

## 2021-08-30 NOTE — Progress Notes (Signed)
Contacted via [employer] ? ? ?Good morning Briana Ward, your labs have returned with exception of Lamictal level which is pending: ?- Kidney function, creatinine and eGFR, remains normal, as is liver function, AST and ALT.   ?- Vitamin D level a little low, please start taking Vitamin D3 2000 units daily which is good for bone health. ?- CBC shows no anemia or infection. Thyroid lab is normal. B12 level normal. ?- Hep C is negative:) ?- Your LDL is above normal. ?The LDL is the bad cholesterol. ?Over time and in combination with inflammation and other factors, this contributes to plaque which in turn may lead to stroke and/or heart attack down the road. ?Sometimes high LDL is primarily genetic, and people might be eating all the right foods but still have high numbers. ?Other times, there is room for improvement in one's diet and eating healthier can bring this number down and potentially reduce one's risk of heart attack and/or stroke.  ?? ?To reduce your LDL, Remember - more fruits and vegetables, more fish, and limit red meat and dairy products. ?More soy, nuts, beans, barley, lentils, oats and plant sterol ester enriched margarine instead of butter. ?I also encourage eliminating sugar and processed food. ?Remember, shop on the outside of the grocery store and visit your [employer]. ??If you would like to talk with me about dietary changes for your cholesterol, please let me know. We should recheck your cholesterol in 12 months.  Any questions? ?Keep being amazing!!  Thank you for allowing me to participate in your care.  I appreciate you. ?Kindest regards, ?Nazeer Romney ? ?? ? ?

## 2021-08-31 LAB — COMPREHENSIVE METABOLIC PANEL
ALT: 12 IU/L (ref 0–32)
AST: 10 IU/L (ref 0–40)
Albumin/Globulin Ratio: 2 (ref 1.2–2.2)
Albumin: 4.1 g/dL (ref 3.8–4.8)
Alkaline Phosphatase: 101 IU/L (ref 44–121)
BUN/Creatinine Ratio: 10 (ref 9–23)
BUN: 9 mg/dL (ref 6–20)
Bilirubin Total: <0.2 mg/dL (ref 0.0–1.2)
CO2: 24 mmol/L (ref 20–29)
Calcium: 8.4 mg/dL — ABNORMAL LOW (ref 8.7–10.2)
Chloride: 103 mmol/L (ref 96–106)
Creatinine, Ser: 0.87 mg/dL (ref 0.57–1.00)
Globulin, Total: 2.1 g/dL (ref 1.5–4.5)
Glucose: 86 mg/dL (ref 70–99)
Potassium: 4.1 mmol/L (ref 3.5–5.2)
Sodium: 140 mmol/L (ref 134–144)
Total Protein: 6.2 g/dL (ref 6.0–8.5)
eGFR: 90 mL/min/{1.73_m2} (ref >59)

## 2021-08-31 LAB — CBC WITH DIFFERENTIAL/PLATELET
Basophils Absolute: 0 10*3/uL (ref 0.0–0.2)
Basos: 1 %
EOS (ABSOLUTE): 0.1 10*3/uL (ref 0.0–0.4)
Eos: 1 %
Hematocrit: 37.5 % (ref 34.0–46.6)
Hemoglobin: 12.7 g/dL (ref 11.1–15.9)
Immature Grans (Abs): 0 10*3/uL (ref 0.0–0.1)
Immature Granulocytes: 0 %
Lymphocytes Absolute: 1.8 10*3/uL (ref 0.7–3.1)
Lymphs: 22 %
MCH: 29.6 pg (ref 26.6–33.0)
MCHC: 33.9 g/dL (ref 31.5–35.7)
MCV: 87 fL (ref 79–97)
Monocytes Absolute: 0.4 10*3/uL (ref 0.1–0.9)
Monocytes: 6 %
Neutrophils Absolute: 5.5 10*3/uL (ref 1.4–7.0)
Neutrophils: 70 %
Platelets: 270 10*3/uL (ref 150–450)
RBC: 4.29 x10E6/uL (ref 3.77–5.28)
RDW: 13.1 % (ref 11.7–15.4)
WBC: 7.9 10*3/uL (ref 3.4–10.8)

## 2021-08-31 LAB — LIPID PANEL W/O CHOL/HDL RATIO
Cholesterol, Total: 184 mg/dL (ref 100–199)
HDL: 42 mg/dL (ref >39)
LDL Chol Calc (NIH): 104 mg/dL — ABNORMAL HIGH (ref 0–99)
Triglycerides: 220 mg/dL — ABNORMAL HIGH (ref 0–149)
VLDL Cholesterol Cal: 38 mg/dL (ref 5–40)

## 2021-08-31 LAB — VITAMIN B12: Vitamin B-12: 445 pg/mL (ref 232–1245)

## 2021-08-31 LAB — VITAMIN D 25 HYDROXY (VIT D DEFICIENCY, FRACTURES): Vit D, 25-Hydroxy: 15.8 ng/mL — ABNORMAL LOW (ref 30.0–100.0)

## 2021-08-31 LAB — LAMOTRIGINE LEVEL: Lamotrigine Lvl: 7.5 ug/mL (ref 2.0–20.0)

## 2021-08-31 LAB — TSH: TSH: 2.07 u[IU]/mL (ref 0.450–4.500)

## 2021-08-31 LAB — HEPATITIS C ANTIBODY: Hep C Virus Ab: NONREACTIVE

## 2021-08-31 NOTE — Progress Notes (Signed)
Contacted via MyChart ? ? ?Lamictal level is in good range.:)

## 2021-09-18 ENCOUNTER — Ambulatory Visit (INDEPENDENT_AMBULATORY_CARE_PROVIDER_SITE_OTHER): Payer: No Typology Code available for payment source | Admitting: Nurse Practitioner

## 2021-09-18 ENCOUNTER — Encounter: Payer: Self-pay | Admitting: Nurse Practitioner

## 2021-09-18 ENCOUNTER — Other Ambulatory Visit: Payer: Self-pay

## 2021-09-18 VITALS — BP 137/93 | HR 69 | Temp 98.0°F

## 2021-09-18 DIAGNOSIS — F321 Major depressive disorder, single episode, moderate: Secondary | ICD-10-CM

## 2021-09-18 DIAGNOSIS — F411 Generalized anxiety disorder: Secondary | ICD-10-CM

## 2021-09-18 MED ORDER — QUETIAPINE FUMARATE 100 MG PO TABS
100.0000 mg | ORAL_TABLET | Freq: Every day | ORAL | 4 refills | Status: DC
  Filled 2021-09-18: qty 90, 90d supply, fill #0

## 2021-09-18 NOTE — Assessment & Plan Note (Signed)
Chronic, ongoing.  Currently continue Zoloft, not sure this is related to medication as had been tolerating well -- suspect more related to recent stressors.  Increase Seroquel to 100 MG for sleep and mood.  Continue this and adjust as needed, wishes to lose weight -- may benefit from time off Seroquel in future to assist with weight loss -- Seroquel ordered by neuro at baseline.  Denies SI/HI.  Urgent referral to psychiatry due to new fugue episode. ?

## 2021-09-18 NOTE — Progress Notes (Signed)
? ?BP (!) 137/93   Pulse 69   Temp 98 ?F (36.7 ?C) (Oral)   SpO2 97%   ? ?Subjective:  ? ? Patient ID: Briana Ward, female    DOB: 11/08/1985, 36 y.o.   MRN: [id number] ? ?HPI: ?Briana Ward is a 36 y.o. female ? ?Chief Complaint  ?Patient presents with  ? Laceration  ?  Patient states Sunday, she says she is not sure if it is her current prescription Zoloft cause her to cut the top of her hand with at steak knife. Patient states she is still currently still taking the prescription. Patient states she may need to have a psych evaluate.   ? ?SKIN LACERATION ?Recently cut her right hand on Sunday while in kitchen on steak knife did this on purpose, she does not recall incident -- wonders if it is her Zoloft that caused this.  Started on Zoloft on 08/29/21 after reporting Lexapro was no longer working and her mother took Zoloft which works well for her.   ? ?Cut area multiple times and then went to sleep.  She was fine with Zoloft prior to this incident.  Has not had an incident like this before.  Denies SI/HI.  Having increased stressors == with her best friend who went back with ex which has made her very upset.  Change is difficult for her.  Still lives with best friend, who is not hanging out with her as much.  No recent alcohol or drug use. ? ?Is scheduled in July for ADD/ADHD testing.   ?Duration: days ?Location: right hand ?History of trauma in area: no ?Pain:  it is sore ?Quality: no ?Severity: mild ?Redness: no ?Swelling: no ?Oozing: no ?Pus: no ?Fevers: no ?Nausea/vomiting: no ?Status: stable ?Treatments attempted:none  ?Tetanus: UTD  ? ?  09/18/2021  ? 11:38 AM 08/29/2021  ?  3:36 PM 08/01/2021  ?  2:15 PM  ?Depression screen PHQ 2/9  ?Decreased Interest 1 0 0  ?Down, Depressed, Hopeless '3 1 1  ' ?PHQ - 2 Score '4 1 1  ' ?Altered sleeping '2 1 3  ' ?Tired, decreased energy '2 1 1  ' ?Change in appetite 1 0 0  ?Feeling bad or failure about yourself  '2 2 1  ' ?Trouble concentrating 0 1 0  ?Moving slowly or  fidgety/restless 0 0 0  ?Suicidal thoughts 1 0 0  ?PHQ-9 Score '12 6 6  ' ?Difficult doing work/chores   Not difficult at all  ?  ? ?  09/18/2021  ? 11:38 AM 08/29/2021  ?  3:36 PM  ?GAD 7 : Generalized Anxiety Score  ?Nervous, Anxious, on Edge 3 2  ?Control/stop worrying 3 1  ?Worry too much - different things 3 2  ?Trouble relaxing 1 1  ?Restless 1 0  ?Easily annoyed or irritable 3 3  ?Afraid - awful might happen 2 2  ?Total GAD 7 Score 16 11  ?Anxiety Difficulty Somewhat difficult Somewhat difficult  ? ?Relevant past medical, surgical, family and social history reviewed and updated as indicated. Interim medical history since our last visit reviewed. ?Allergies and medications reviewed and updated. ? ?Review of Systems  ?Constitutional:  Negative for activity change, appetite change, diaphoresis, fatigue and fever.  ?Respiratory:  Negative for cough, chest tightness and shortness of breath.   ?Cardiovascular:  Negative for chest pain, palpitations and leg swelling.  ?Gastrointestinal: Negative.   ?Neurological: Negative.   ?Psychiatric/Behavioral:  Positive for decreased concentration and sleep disturbance. Negative for self-injury and suicidal ideas. The  patient is nervous/anxious.   ? ?Per HPI unless specifically indicated above ? ?   ?Objective:  ?  ?BP (!) 137/93   Pulse 69   Temp 98 ?F (36.7 ?C) (Oral)   SpO2 97%   ?Wt Readings from Last 3 Encounters:  ?08/29/21 234 lb 12.8 oz (106.5 kg)  ?08/23/21 229 lb (103.9 kg)  ?08/01/21 238 lb 12.8 oz (108.3 kg)  ?  ?Physical Exam ?Vitals and nursing note reviewed.  ?Constitutional:   ?   General: She is awake.  ?   Appearance: She is well-developed.  ?HENT:  ?   Head: Normocephalic.  ?   Right Ear: Hearing normal.  ?   Left Ear: Hearing normal.  ?   Nose: Nose normal.  ?   Mouth/Throat:  ?   Mouth: Mucous membranes are moist.  ?Eyes:  ?   General: Lids are normal.     ?   Right eye: No discharge.     ?   Left eye: No discharge.  ?   Conjunctiva/sclera: Conjunctivae  normal.  ?   Pupils: Pupils are equal, round, and reactive to light.  ?Neck:  ?   Thyroid: No thyromegaly.  ?   Vascular: No carotid bruit or JVD.  ?Cardiovascular:  ?   Rate and Rhythm: Normal rate and regular rhythm.  ?   Heart sounds: Normal heart sounds. No murmur heard. ?  No gallop.  ?Pulmonary:  ?   Effort: Pulmonary effort is normal.  ?   Breath sounds: Normal breath sounds.  ?Abdominal:  ?   General: Bowel sounds are normal.  ?   Palpations: Abdomen is soft.  ?Musculoskeletal:  ?   Cervical back: Normal range of motion and neck supple.  ?   Right lower leg: No edema.  ?   Left lower leg: No edema.  ?Lymphadenopathy:  ?   Cervical: No cervical adenopathy.  ?Skin: ?   General: Skin is warm and dry.  ? ?    ?Neurological:  ?   Mental Status: She is alert and oriented to person, place, and time.  ?Psychiatric:     ?   Attention and Perception: Attention normal.     ?   Mood and Affect: Mood normal.     ?   Behavior: Behavior normal. Behavior is cooperative.     ?   Thought Content: Thought content normal.     ?   Judgment: Judgment normal.  ? ? ?Results for orders placed or performed in visit on 08/29/21  ?Bayer DCA Hb A1c Waived  ?Result Value Ref Range  ? HB A1C (BAYER DCA - WAIVED) 4.7 (L) 4.8 - 5.6 %  ?Microalbumin, Urine Waived  ?Result Value Ref Range  ? Microalb, Ur Waived 10 0 - 19 mg/L  ? Creatinine, Urine Waived 200 10 - 300 mg/dL  ? Microalb/Creat Ratio <30 <30 mg/g  ?CBC with Differential/Platelet  ?Result Value Ref Range  ? WBC 7.9 3.4 - 10.8 x10E3/uL  ? RBC 4.29 3.77 - 5.28 x10E6/uL  ? Hemoglobin 12.7 11.1 - 15.9 g/dL  ? Hematocrit 37.5 34.0 - 46.6 %  ? MCV 87 79 - 97 fL  ? MCH 29.6 26.6 - 33.0 pg  ? MCHC 33.9 31.5 - 35.7 g/dL  ? RDW 13.1 11.7 - 15.4 %  ? Platelets 270 150 - 450 x10E3/uL  ? Neutrophils 70 Not Estab. %  ? Lymphs 22 Not Estab. %  ? Monocytes 6 Not Estab. %  ?  Eos 1 Not Estab. %  ? Basos 1 Not Estab. %  ? Neutrophils Absolute 5.5 1.4 - 7.0 x10E3/uL  ? Lymphocytes Absolute 1.8 0.7 -  3.1 x10E3/uL  ? Monocytes Absolute 0.4 0.1 - 0.9 x10E3/uL  ? EOS (ABSOLUTE) 0.1 0.0 - 0.4 x10E3/uL  ? Basophils Absolute 0.0 0.0 - 0.2 x10E3/uL  ? Immature Granulocytes 0 Not Estab. %  ? Immature Grans (Abs) 0.0 0.0 - 0.1 x10E3/uL  ?Comprehensive metabolic panel  ?Result Value Ref Range  ? Glucose 86 70 - 99 mg/dL  ? BUN 9 6 - 20 mg/dL  ? Creatinine, Ser 0.87 0.57 - 1.00 mg/dL  ? eGFR 90 >59 mL/min/1.73  ? BUN/Creatinine Ratio 10 9 - 23  ? Sodium 140 134 - 144 mmol/L  ? Potassium 4.1 3.5 - 5.2 mmol/L  ? Chloride 103 96 - 106 mmol/L  ? CO2 24 20 - 29 mmol/L  ? Calcium 8.4 (L) 8.7 - 10.2 mg/dL  ? Total Protein 6.2 6.0 - 8.5 g/dL  ? Albumin 4.1 3.8 - 4.8 g/dL  ? Globulin, Total 2.1 1.5 - 4.5 g/dL  ? Albumin/Globulin Ratio 2.0 1.2 - 2.2  ? Bilirubin Total <0.2 0.0 - 1.2 mg/dL  ? Alkaline Phosphatase 101 44 - 121 IU/L  ? AST 10 0 - 40 IU/L  ? ALT 12 0 - 32 IU/L  ?Lipid Panel w/o Chol/HDL Ratio  ?Result Value Ref Range  ? Cholesterol, Total 184 100 - 199 mg/dL  ? Triglycerides 220 (H) 0 - 149 mg/dL  ? HDL 42 >39 mg/dL  ? VLDL Cholesterol Cal 38 5 - 40 mg/dL  ? LDL Chol Calc (NIH) 104 (H) 0 - 99 mg/dL  ?TSH  ?Result Value Ref Range  ? TSH 2.070 0.450 - 4.500 uIU/mL  ?Vitamin B12  ?Result Value Ref Range  ? Vitamin B-12 445 232 - 1,245 pg/mL  ?VITAMIN D 25 Hydroxy (Vit-D Deficiency, Fractures)  ?Result Value Ref Range  ? Vit D, 25-Hydroxy 15.8 (L) 30.0 - 100.0 ng/mL  ?Hepatitis C antibody  ?Result Value Ref Range  ? Hep C Virus Ab Non Reactive Non Reactive  ?Lamotrigine level  ?Result Value Ref Range  ? Lamotrigine Lvl 7.5 2.0 - 20.0 ug/mL  ? ?   ?Assessment & Plan:  ? ?Problem List Items Addressed This Visit   ? ?  ? Other  ? Depression, major, single episode, moderate ([place]) - Primary  ?  Chronic, ongoing.  Denis SI/HI.  Currently continue Zoloft, not sure this is related to medication as had been tolerating well -- suspect more related to recent stressors.  Increase Seroquel to 100 MG for sleep and mood.  Continue this  and adjust as needed, wishes to lose weight -- may benefit from time off Seroquel in future to assist with weight loss -- Seroquel ordered by neuro at baseline. Urgent referral to psychiatry due to new fugue episode.  [place]

## 2021-09-18 NOTE — Patient Instructions (Signed)

## 2021-09-18 NOTE — Assessment & Plan Note (Signed)
Chronic, ongoing.  Briana Ward SI/HI.  Currently continue Zoloft, not sure this is related to medication as had been tolerating well -- suspect more related to recent stressors.  Increase Seroquel to 100 MG for sleep and mood.  Continue this and adjust as needed, wishes to lose weight -- may benefit from time off Seroquel in future to assist with weight loss -- Seroquel ordered by neuro at baseline. Urgent referral to psychiatry due to new fugue episode.  Return in 2 weeks. ?

## 2021-09-19 ENCOUNTER — Ambulatory Visit: Payer: No Typology Code available for payment source | Admitting: Nurse Practitioner

## 2021-09-20 ENCOUNTER — Other Ambulatory Visit: Payer: Self-pay

## 2021-09-20 ENCOUNTER — Telehealth: Payer: No Typology Code available for payment source | Admitting: Emergency Medicine

## 2021-09-20 DIAGNOSIS — J019 Acute sinusitis, unspecified: Secondary | ICD-10-CM

## 2021-09-20 DIAGNOSIS — B9689 Other specified bacterial agents as the cause of diseases classified elsewhere: Secondary | ICD-10-CM | POA: Diagnosis not present

## 2021-09-20 MED ORDER — DOXYCYCLINE HYCLATE 100 MG PO TABS
100.0000 mg | ORAL_TABLET | Freq: Two times a day (BID) | ORAL | 0 refills | Status: DC
  Filled 2021-09-20: qty 20, 10d supply, fill #0

## 2021-09-20 NOTE — Progress Notes (Signed)
E-Visit for Sinus Problems  We are sorry that you are not feeling well.  Here is how we plan to help!  Based on what you have shared with me it looks like you have sinusitis.  Sinusitis is inflammation and infection in the sinus cavities of the head.  Based on your presentation I believe you most likely have Acute Bacterial Sinusitis.  This is an infection caused by bacteria and is treated with antibiotics. I have prescribed Doxycycline 100mg  by mouth twice a day for 10 days. You may use an oral decongestant such as Mucinex D or if you have glaucoma or high blood pressure use plain Mucinex.   Saline nasal spray help and can safely be used as often as needed for congestion.  Or try using saline irrigation, such as with a neti pot, several times a day while you are sick. Many neti pots come with salt packets premeasured to use to make saline. If you use your own salt, make sure it is kosher salt or sea salt (don't use table salt as it has iodine in it and you don't need that in your nose). Use distilled water to make saline. If you mix your own saline using your own salt, the recipe is 1/4 teaspoon salt in 1 cup warm water. Using saline irrigation can help prevent and treat sinus infections.   If you develop worsening sinus pain, fever or notice severe headache and vision changes, or if symptoms are not better after completion of antibiotic, please schedule an appointment with a health care provider.    Sinus infections are not as easily transmitted as other respiratory infection, however we still recommend that you avoid close contact with loved ones, especially the very young and elderly.  Remember to wash your hands thoroughly throughout the day as this is the number one way to prevent the spread of infection!  Home Care: Only take medications as instructed by your medical team. Complete the entire course of an antibiotic. Do not take these medications with alcohol. A steam or ultrasonic humidifier  can help congestion.  You can place a towel over your head and breathe in the steam from hot water coming from a faucet. Avoid close contacts especially the very young and the elderly. Cover your mouth when you cough or sneeze. Always remember to wash your hands.  Get Help Right Away If: You develop worsening fever or sinus pain. You develop a severe head ache or visual changes. Your symptoms persist after you have completed your treatment plan.  Make sure you Understand these instructions. Will watch your condition. Will get help right away if you are not doing well or get worse.  Thank you for choosing an e-visit.  Your e-visit answers were reviewed by a board certified advanced clinical practitioner to complete your personal care plan. Depending upon the condition, your plan could have included both over the counter or prescription medications.  Please review your pharmacy choice. Make sure the pharmacy is open so you can pick up prescription now. If there is a problem, you may contact your provider through [employer] and have the prescription routed to another pharmacy.  Your safety is important to [place]. If you have drug allergies check your prescription carefully.   For the next 24 hours you can use MyChart to ask questions about today's visit, request a non-urgent call back, or ask for a work or school excuse. You will get an email in the next two days asking about your  experience. I hope that your e-visit has been valuable and will speed your recovery.   I have spent 5 minutes in review of e-visit questionnaire, review and updating patient chart, medical decision making and response to patient.   Willeen Cass, PhD, FNP-BC

## 2021-09-26 ENCOUNTER — Telehealth (INDEPENDENT_AMBULATORY_CARE_PROVIDER_SITE_OTHER): Payer: Self-pay

## 2021-09-26 ENCOUNTER — Other Ambulatory Visit: Payer: Self-pay

## 2021-09-26 MED ORDER — LOSARTAN POTASSIUM 25 MG PO TABS
25.0000 mg | ORAL_TABLET | Freq: Every day | ORAL | 4 refills | Status: DC
Start: 2021-09-26 — End: 2022-08-27
  Filled 2021-09-26: qty 90, 90d supply, fill #0
  Filled 2021-12-17: qty 90, 90d supply, fill #1
  Filled 2022-03-20: qty 90, 90d supply, fill #2
  Filled 2022-06-18: qty 90, 90d supply, fill #3
  Filled 2022-08-26: qty 90, 90d supply, fill #4

## 2021-09-26 NOTE — Telephone Encounter (Signed)
Patient called informing that she has not receive her medication for the laser on tomorrow. Xanax 0.5mg  #2 has been called into [facility] pharmacy and message was left on patient voicemail

## 2021-09-26 NOTE — Progress Notes (Unsigned)
    MRN : [id number]  Briana Ward is a 36 y.o. (Feb 10, 1986) female who presents with chief complaint of No chief complaint on file. .    The patient's right lower extremity was sterilely prepped and draped.  The ultrasound machine was used to visualize the right great saphenous vein throughout its course.  A segment below the knee was selected for access.  The saphenous vein was accessed without difficulty using ultrasound guidance with a micropuncture needle.   An 0.018  wire was placed beyond the saphenofemoral junction through the sheath and the microneedle was removed.  The 65 cm sheath was then placed over the wire and the wire and dilator were removed.  The laser fiber was placed through the sheath and its tip was placed approximately 2 cm below the saphenofemoral junction.  Tumescent anesthesia was then created with a dilute lidocaine solution.  Laser energy was then delivered with constant withdrawal of the sheath and laser fiber.  Approximately 1536 Joules of energy were delivered over a length of 43 cm.  Sterile dressings were placed.  The patient tolerated the procedure well without complications.

## 2021-09-27 ENCOUNTER — Other Ambulatory Visit: Payer: Self-pay

## 2021-09-27 ENCOUNTER — Ambulatory Visit (INDEPENDENT_AMBULATORY_CARE_PROVIDER_SITE_OTHER): Payer: No Typology Code available for payment source | Admitting: Vascular Surgery

## 2021-09-27 ENCOUNTER — Encounter (INDEPENDENT_AMBULATORY_CARE_PROVIDER_SITE_OTHER): Payer: Self-pay | Admitting: Vascular Surgery

## 2021-09-27 VITALS — BP 123/82 | HR 71 | Resp 16 | Wt 227.0 lb

## 2021-09-27 DIAGNOSIS — I8310 Varicose veins of unspecified lower extremity with inflammation: Secondary | ICD-10-CM | POA: Diagnosis not present

## 2021-09-27 MED ORDER — ALPRAZOLAM 0.5 MG PO TABS
ORAL_TABLET | ORAL | 0 refills | Status: DC
  Filled 2021-09-27: qty 2, 1d supply, fill #0

## 2021-09-30 NOTE — Patient Instructions (Incomplete)

## 2021-10-03 ENCOUNTER — Other Ambulatory Visit: Payer: Self-pay

## 2021-10-03 ENCOUNTER — Ambulatory Visit: Payer: No Typology Code available for payment source | Admitting: Nurse Practitioner

## 2021-10-03 ENCOUNTER — Other Ambulatory Visit (INDEPENDENT_AMBULATORY_CARE_PROVIDER_SITE_OTHER): Payer: Self-pay | Admitting: Vascular Surgery

## 2021-10-03 ENCOUNTER — Telehealth: Payer: Self-pay

## 2021-10-03 DIAGNOSIS — I8311 Varicose veins of right lower extremity with inflammation: Secondary | ICD-10-CM

## 2021-10-03 NOTE — Telephone Encounter (Signed)
Briana Ward wanted to know what she should do now that she started [facility] and now is not available at [facility], or other pharmacy's right now?  Please advise

## 2021-10-04 ENCOUNTER — Encounter (INDEPENDENT_AMBULATORY_CARE_PROVIDER_SITE_OTHER): Payer: No Typology Code available for payment source

## 2021-10-04 ENCOUNTER — Other Ambulatory Visit: Payer: Self-pay | Admitting: Physical Medicine & Rehabilitation

## 2021-10-04 DIAGNOSIS — G8929 Other chronic pain: Secondary | ICD-10-CM

## 2021-10-10 ENCOUNTER — Other Ambulatory Visit: Payer: Self-pay

## 2021-10-10 ENCOUNTER — Ambulatory Visit: Payer: No Typology Code available for payment source

## 2021-10-14 ENCOUNTER — Ambulatory Visit
Admission: RE | Admit: 2021-10-14 | Discharge: 2021-10-14 | Disposition: A | Discharge status: Home / Self Care | Payer: No Typology Code available for payment source | Source: Ambulatory Visit | Attending: Physical Medicine & Rehabilitation | Admitting: Physical Medicine & Rehabilitation

## 2021-10-14 DIAGNOSIS — M5442 Lumbago with sciatica, left side: Secondary | ICD-10-CM | POA: Insufficient documentation

## 2021-10-14 DIAGNOSIS — G8929 Other chronic pain: Secondary | ICD-10-CM | POA: Diagnosis present

## 2021-10-14 DIAGNOSIS — M5441 Lumbago with sciatica, right side: Secondary | ICD-10-CM | POA: Insufficient documentation

## 2021-10-15 ENCOUNTER — Telehealth: Payer: Self-pay

## 2021-10-15 NOTE — Telephone Encounter (Signed)
Patient made aware of results and verbalized understanding.  

## 2021-10-15 NOTE — Telephone Encounter (Signed)
Briana Ward wanted me to let you know this " PLEASE let Briana Ward know that I cannot take sertraline... I did not take it this morning.. I was borderline suicidal on Saturday.. no, I didn't do anything... but.. its not working and Is making it worse... so right now, I'm not on anything for depression or anxiety so it's just a matter of time before I crack."

## 2021-10-16 ENCOUNTER — Other Ambulatory Visit: Payer: Self-pay

## 2021-10-16 MED ORDER — WEGOVY 1 MG/0.5ML ~~LOC~~ SOAJ
1.0000 mg | SUBCUTANEOUS | 1 refills | Status: DC
Start: 2021-10-16 — End: 2021-12-24
  Filled 2021-10-16: qty 2, 28d supply, fill #0

## 2021-10-18 ENCOUNTER — Other Ambulatory Visit: Payer: Self-pay

## 2021-10-18 MED ORDER — GABAPENTIN 300 MG PO CAPS
ORAL_CAPSULE | ORAL | 1 refills | Status: DC
  Filled 2021-10-18: qty 90, 30d supply, fill #0

## 2021-10-20 NOTE — Patient Instructions (Signed)

## 2021-10-24 ENCOUNTER — Ambulatory Visit (INDEPENDENT_AMBULATORY_CARE_PROVIDER_SITE_OTHER): Payer: No Typology Code available for payment source | Admitting: Nurse Practitioner

## 2021-10-24 ENCOUNTER — Encounter: Payer: Self-pay | Admitting: Nurse Practitioner

## 2021-10-24 VITALS — BP 116/79 | HR 80 | Temp 98.0°F

## 2021-10-24 DIAGNOSIS — G8929 Other chronic pain: Secondary | ICD-10-CM | POA: Diagnosis not present

## 2021-10-24 DIAGNOSIS — F411 Generalized anxiety disorder: Secondary | ICD-10-CM

## 2021-10-24 DIAGNOSIS — M5442 Lumbago with sciatica, left side: Secondary | ICD-10-CM | POA: Diagnosis not present

## 2021-10-24 DIAGNOSIS — M545 Other chronic pain: Secondary | ICD-10-CM | POA: Insufficient documentation

## 2021-10-24 DIAGNOSIS — F321 Major depressive disorder, single episode, moderate: Secondary | ICD-10-CM | POA: Diagnosis not present

## 2021-10-24 MED ORDER — QUETIAPINE FUMARATE 100 MG PO TABS
150.0000 mg | ORAL_TABLET | Freq: Every day | ORAL | 4 refills | Status: DC
Start: 2021-10-24 — End: 2022-02-04

## 2021-10-24 NOTE — Progress Notes (Signed)
BP 116/79   Pulse 80   Temp 98 F (36.7 C) (Oral)   SpO2 99%    Subjective:    Patient ID: Briana Ward, female    DOB: 10-28-1985, 36 y.o.   MRN: [id number]  HPI: Briana Ward is a 36 y.o. female  Chief Complaint  Patient presents with   Medication Management    Patient states the Zoloft has caused her to have continued episodes. Patient states she stopped the medication about a week ago. Patient states since she has stopped the medication, she noticed she is having less episodes.    Back Pain   DEPRESSION Continues on Seroquel 100 MG at bedtime.  Has Lamictal for seizure disorder.  Recently tried Sertraline but this caused worsening depression, she stopped taking and this improved.  Has referral to psychiatry in place.  Is scheduled to see psychiatry 11/14/21 [facility]) and ADHD testing on 11/22/21.  She suspects some undiagnosed Bipolar in family members.   Does endorse she personally has highs and lows + difficulty with anger.   Mood status: uncontrolled Satisfied with current treatment?: yes Symptom severity: moderate  Duration of current treatment : chronic Side effects: no Medication compliance: good compliance Psychotherapy/counseling: yes in the past Depressed mood: yes Anxious mood: yes Anhedonia: no Significant weight loss or gain: no Insomnia: yes hard to fall asleep Fatigue: yes Feelings of worthlessness or guilt: yes Impaired concentration/indecisiveness: yes Suicidal ideations: yes -- reports occasional fleeting thoughts, but no plans, these were worsened with Zoloft -- denies any plans and reports safety plan in place Hopelessness: yes Crying spells: yes    10/24/2021    3:36 PM 09/18/2021   11:38 AM 08/29/2021    3:36 PM 08/01/2021    2:15 PM  Depression screen PHQ 2/9  Decreased Interest 1 1 0 0  Down, Depressed, Hopeless _0 PHQ - 2 Score _1 Altered sleeping _2 Tired, decreased energy _3 Change in appetite 0 1 0 0   Feeling bad or failure about yourself  _4 Trouble concentrating 0 0 1 0  Moving slowly or fidgety/restless 0 0 0 0  Suicidal thoughts 1 1 0 0  PHQ-9 Score _5 Difficult doing work/chores Very difficult   Not difficult at all       10/24/2021    3:36 PM 09/18/2021   11:38 AM 08/29/2021    3:36 PM  GAD 7 : Generalized Anxiety Score  Nervous, Anxious, on Edge _6 Control/stop worrying _7 Worry too much - different things _8 Trouble relaxing 0 1 1  Restless 0 1 0  Easily annoyed or irritable _9 Afraid - awful might happen _10 Total GAD 7 Score _11 Anxiety Difficulty Somewhat difficult Somewhat difficult Somewhat difficult   BACK PAIN Follows with physiatry [facility] -- had MRI recently noting disc protrusion at L5-S1 with impingement and degenerative disc. Started Gabapentin and starting physical therapy.  They are discussing surgery. Duration: months Mechanism of injury: unknown Location: bilateral and low back Onset: gradual Severity: 8/10 Quality: sharp, dull, aching, and throbbing Frequency: constant Radiation: L leg below the knee Aggravating factors: lifting, movement, and bending, coughing Alleviating factors: Gabapentin  Status: fluctuating Treatments attempted:  Gabapentin Relief with NSAIDs?: No NSAIDs Taken Nighttime pain:  yes Paresthesias /  decreased sensation:  yes down left leg Bowel / bladder incontinence:  no Fevers:  no Dysuria / urinary frequency:  no   Relevant past medical, surgical, family and social history reviewed and updated as indicated. Interim medical history since our last visit reviewed. Allergies and medications reviewed and updated.  Review of Systems  Constitutional:  Negative for activity change, appetite change, diaphoresis, fatigue and fever.  Respiratory:  Negative for cough, chest tightness and shortness of breath.   Cardiovascular:  Negative for chest pain, palpitations and leg swelling.   Gastrointestinal: Negative.   Neurological: Negative.   Psychiatric/Behavioral:  Positive for decreased concentration, sleep disturbance and suicidal ideas (occasional fleeting with no plan and safety plan in place). Negative for self-injury. The patient is nervous/anxious.     Per HPI unless specifically indicated above     Objective:    BP 116/79   Pulse 80   Temp 98 F (36.7 C) (Oral)   SpO2 99%   Wt Readings from Last 3 Encounters:  09/27/21 227 lb (103 kg)  08/29/21 234 lb 12.8 oz (106.5 kg)  08/23/21 229 lb (103.9 kg)    Physical Exam Vitals and nursing note reviewed.  Constitutional:      General: She is awake. She is not in acute distress.    Appearance: She is well-developed and well-groomed. She is obese. She is not ill-appearing or toxic-appearing.  HENT:     Head: Normocephalic.     Right Ear: Hearing normal.     Left Ear: Hearing normal.  Eyes:     General: Lids are normal.        Right eye: No discharge.        Left eye: No discharge.     Conjunctiva/sclera: Conjunctivae normal.     Pupils: Pupils are equal, round, and reactive to light.  Neck:     Thyroid: No thyromegaly.     Vascular: No carotid bruit.  Cardiovascular:     Rate and Rhythm: Normal rate and regular rhythm.     Heart sounds: Normal heart sounds. No murmur heard.    No gallop.  Pulmonary:     Effort: Pulmonary effort is normal. No accessory muscle usage or respiratory distress.     Breath sounds: Normal breath sounds.  Abdominal:     General: Bowel sounds are normal.     Palpations: Abdomen is soft.  Musculoskeletal:     Cervical back: Normal range of motion and neck supple.     Right lower leg: No edema.     Left lower leg: No edema.  Lymphadenopathy:     Cervical: No cervical adenopathy.  Skin:    General: Skin is warm and dry.  Neurological:     Mental Status: She is alert and oriented to person, place, and time.  Psychiatric:        Attention and Perception: Attention  normal.        Mood and Affect: Mood normal.        Speech: Speech normal.        Behavior: Behavior normal. Behavior is cooperative.        Thought Content: Thought content normal.    Results for orders placed or performed in visit on 08/29/21  Bayer DCA Hb A1c Waived  Result Value Ref Range   HB A1C (BAYER DCA - WAIVED) 4.7 (L) 4.8 - 5.6 %  Microalbumin, Urine Waived  Result Value Ref Range   Microalb, Ur Waived 10 0 - 19 mg/L  Creatinine, Urine Waived 200 10 - 300 mg/dL   Microalb/Creat Ratio <30 <30 mg/g  CBC with Differential/Platelet  Result Value Ref Range   WBC 7.9 3.4 - 10.8 x10E3/uL   RBC 4.29 3.77 - 5.28 x10E6/uL   Hemoglobin 12.7 11.1 - 15.9 g/dL   Hematocrit 37.5 34.0 - 46.6 %   MCV 87 79 - 97 fL   MCH 29.6 26.6 - 33.0 pg   MCHC 33.9 31.5 - 35.7 g/dL   RDW 13.1 11.7 - 15.4 %   Platelets 270 150 - 450 x10E3/uL   Neutrophils 70 Not Estab. %   Lymphs 22 Not Estab. %   Monocytes 6 Not Estab. %   Eos 1 Not Estab. %   Basos 1 Not Estab. %   Neutrophils Absolute 5.5 1.4 - 7.0 x10E3/uL   Lymphocytes Absolute 1.8 0.7 - 3.1 x10E3/uL   Monocytes Absolute 0.4 0.1 - 0.9 x10E3/uL   EOS (ABSOLUTE) 0.1 0.0 - 0.4 x10E3/uL   Basophils Absolute 0.0 0.0 - 0.2 x10E3/uL   Immature Granulocytes 0 Not Estab. %   Immature Grans (Abs) 0.0 0.0 - 0.1 x10E3/uL  Comprehensive metabolic panel  Result Value Ref Range   Glucose 86 70 - 99 mg/dL   BUN 9 6 - 20 mg/dL   Creatinine, Ser 0.87 0.57 - 1.00 mg/dL   eGFR 90 >59 mL/min/1.73   BUN/Creatinine Ratio 10 9 - 23   Sodium 140 134 - 144 mmol/L   Potassium 4.1 3.5 - 5.2 mmol/L   Chloride 103 96 - 106 mmol/L   CO2 24 20 - 29 mmol/L   Calcium 8.4 (L) 8.7 - 10.2 mg/dL   Total Protein 6.2 6.0 - 8.5 g/dL   Albumin 4.1 3.8 - 4.8 g/dL   Globulin, Total 2.1 1.5 - 4.5 g/dL   Albumin/Globulin Ratio 2.0 1.2 - 2.2   Bilirubin Total <0.2 0.0 - 1.2 mg/dL   Alkaline Phosphatase 101 44 - 121 IU/L   AST 10 0 - 40 IU/L   ALT 12 0 - 32 IU/L  Lipid  Panel w/o Chol/HDL Ratio  Result Value Ref Range   Cholesterol, Total 184 100 - 199 mg/dL   Triglycerides 220 (H) 0 - 149 mg/dL   HDL 42 >39 mg/dL   VLDL Cholesterol Cal 38 5 - 40 mg/dL   LDL Chol Calc (NIH) 104 (H) 0 - 99 mg/dL  TSH  Result Value Ref Range   TSH 2.070 0.450 - 4.500 uIU/mL  Vitamin B12  Result Value Ref Range   Vitamin B-12 445 232 - 1,245 pg/mL  VITAMIN D 25 Hydroxy (Vit-D Deficiency, Fractures)  Result Value Ref Range   Vit D, 25-Hydroxy 15.8 (L) 30.0 - 100.0 ng/mL  Hepatitis C antibody  Result Value Ref Range   Hep C Virus Ab Non Reactive Non Reactive  Lamotrigine level  Result Value Ref Range   Lamotrigine Lvl 7.5 2.0 - 20.0 ug/mL      Assessment & Plan:   Problem List Items Addressed This Visit       Other   Chronic bilateral low back pain    Chronic and followed by physiatry.  Continue this collaboration, recent notes and imaging reviewed.      Depression, major, single episode, moderate (HCC) - Primary    Chronic, ongoing.  Denis SI/HI today, but has had fleeting thoughts with no plans.  Safety plan in place and aware to go to ER immediately if plan presents.  Discontinued Sertraline.  Increase Seroquel to  150 MG for sleep and mood.  Continue this and adjust as needed, wishes to lose weight -- may benefit from time off Seroquel in future to assist with weight loss -- Seroquel ordered by neuro at baseline. ?more Bipolar or other mood disorder as multiple positives on MDQ (will scan into chart).  Scheduled to see psychiatry upcoming which will offer benefit, appreciate their input.  Return in August.      Generalized anxiety disorder    Refer to depression plan of care.        Follow up plan: Return in about 9 weeks (around 12/26/2021) for MOOD AND BACK PAIN.

## 2021-10-24 NOTE — Assessment & Plan Note (Signed)
Chronic and followed by physiatry.  Continue this collaboration, recent notes and imaging reviewed.

## 2021-10-24 NOTE — Assessment & Plan Note (Signed)
Refer to depression plan of care. 

## 2021-10-24 NOTE — Progress Notes (Unsigned)
MRN : [id number]  Briana Ward is a 36 y.o. (January 22, 1986) female who presents with chief complaint of varicose veins hurt.  History of Present Illness:   The patient returns to the office for followup status post laser ablation of the *** saphenous vein on *** and laser ablation of the *** saphenous vein on ***.    The patient note significant improvement in the lower extremity pain but not resolution of the symptoms. The patient notes multiple residual varicosities bilaterally which continued to hurt with dependent positions and remained tender to palpation. The patient's swelling is minimally from preoperative status. The patient continues to wear graduated compression stockings on a daily basis but these are not eliminating the pain and discomfort. The patient continues to use over-the-counter anti-inflammatory medications to treat the pain and related symptoms but this has not given the patient relief. The patient notes the pain in the lower extremities is causing problems with daily exercise, problems at work and even with household activities such as preparing meals and doing dishes.  The patient is otherwise done well and there have been no complications related to the laser procedure or interval changes in the patient's overall   Post laser ultrasound shows successful ablation of the ***    No outpatient medications have been marked as taking for the 10/25/21 encounter (Appointment) with Gilda Crease, Latina Craver, MD.    Past Medical History:  Diagnosis Date   Acne    Anxiety    Increased BMI    Irregular menses    Monilial vulvovaginitis    Seizure disorder (HCC)    Stroke [facility])     Past Surgical History:  Procedure Laterality Date   CESAREAN SECTION     CHOLECYSTECTOMY     TONSILLECTOMY AND ADENOIDECTOMY      Social History Social History   Tobacco Use   Smoking status: Never   Smokeless tobacco: Never  Substance Use Topics   Alcohol use: No   Drug use: No     Family History Family History  Problem Relation Age of Onset   Heart disease Mother    Diabetes Maternal Aunt    Breast cancer Maternal Aunt    Osteoporosis Maternal Grandmother    Colon cancer Neg Hx    Ovarian cancer Neg Hx     Allergies  Allergen Reactions   Aloe Vera Cough   Lisinopril Cough   Penicillins      REVIEW OF SYSTEMS (Negative unless checked)  Constitutional: [] Weight loss  [] Fever  [] Chills Cardiac: [] Chest pain   [] Chest pressure   [] Palpitations   [] Shortness of breath when laying flat   [] Shortness of breath with exertion. Vascular:  [] Pain in legs with walking   [x] Pain in legs with standing  [] History of DVT   [] Phlebitis   [] Swelling in legs   [x] Varicose veins   [] Non-healing ulcers Pulmonary:   [] Uses home oxygen   [] Productive cough   [] Hemoptysis   [] Wheeze  [] COPD   [] Asthma Neurologic:  [] Dizziness   [] Seizures   [] History of stroke   [] History of TIA  [] Aphasia   [] Vissual changes   [] Weakness or numbness in arm   [] Weakness or numbness in leg Musculoskeletal:   [] Joint swelling   [] Joint pain   [] Low back pain Hematologic:  [] Easy bruising  [] Easy bleeding   [] Hypercoagulable state   [] Anemic Gastrointestinal:  [] Diarrhea   [] Vomiting  [] Gastroesophageal reflux/heartburn   [] Difficulty swallowing. Genitourinary:  [] Chronic kidney disease   []   Difficult urination  [] Frequent urination   [] Blood in urine Skin:  [] Rashes   [] Ulcers  Psychological:  [] History of anxiety   []  History of major depression.  Physical Examination  There were no vitals filed for this visit. There is no height or weight on file to calculate BMI. Gen: WD/WN, NAD Head: [place]/AT, No temporalis wasting.  Ear/Nose/Throat: Hearing grossly intact, nares w/o erythema or drainage, pinna without lesions Eyes: PER, EOMI, sclera nonicteric.  Neck: Supple, no gross masses.  No JVD.  Pulmonary:  Good air movement, no audible wheezing, no use of accessory muscles.  Cardiac: RRR,  precordium not hyperdynamic. Vascular:  Large varicosities present, greater than 10 mm ***.  Veins are tender to palpation  Moderate venous stasis changes to the legs bilaterally.  Trace soft pitting edema  Vessel Right Left  Radial Palpable Palpable  Gastrointestinal: soft, non-distended. No guarding/no peritoneal signs.  Musculoskeletal: M/S 5/5 throughout.  No deformity.  Neurologic: CN 2-12 intact. Pain and light touch intact in extremities.  Symmetrical.  Speech is fluent. Motor exam as listed above. Psychiatric: Judgment intact, Mood & affect appropriate for pt's clinical situation. Dermatologic: Venous rashes no ulcers noted.  No changes consistent with cellulitis. Lymph : No lichenification or skin changes of chronic lymphedema.  CBC Lab Results  Component Value Date   WBC 7.9 08/29/2021   HGB 12.7 08/29/2021   HCT 37.5 08/29/2021   MCV 87 08/29/2021   PLT 270 08/29/2021    BMET    Component Value Date/Time   NA 140 08/29/2021 1539   NA 137 01/13/2012 1659   K 4.1 08/29/2021 1539   K 3.8 01/13/2012 1659   CL 103 08/29/2021 1539   CL 103 01/13/2012 1659   CO2 24 08/29/2021 1539   CO2 30 01/13/2012 1659   GLUCOSE 86 08/29/2021 1539   GLUCOSE 102 (H) 01/29/2018 1810   GLUCOSE 85 01/13/2012 1659   BUN 9 08/29/2021 1539   BUN 11 01/13/2012 1659   CREATININE 0.87 08/29/2021 1539   CREATININE 0.62 01/13/2012 1659   CALCIUM 8.4 (L) 08/29/2021 1539   CALCIUM 9.4 01/13/2012 1659   GFRNONAA >60 01/29/2018 1810   GFRNONAA >60 01/13/2012 1659   GFRAA >60 01/29/2018 1810   GFRAA >60 01/13/2012 1659   CrCl cannot be calculated (Patient's most recent lab result is older than the maximum 21 days allowed.).  COAG Lab Results  Component Value Date   INR 0.9 01/13/2012    Radiology MR LUMBAR SPINE WO CONTRAST  Result Date: 10/16/2021 CLINICAL DATA:  Initial evaluation for chronic right hip and leg pain. New onset left-sided symptoms. EXAM: MRI LUMBAR SPINE WITHOUT  CONTRAST TECHNIQUE: Multiplanar, multisequence MR imaging of the lumbar spine was performed. No intravenous contrast was administered. COMPARISON:  None Available. FINDINGS: Segmentation: Standard. Lowest well-formed disc space labeled the L5-S1 level. Alignment: Physiologic with preservation of the normal lumbar lordosis. No listhesis. Vertebrae: Vertebral body height maintained without acute or chronic fracture. Bone marrow signal intensity diffusely decreased on T1 weighted sequence, nonspecific, but most commonly related to anemia, smoking or obesity. Few scattered benign hemangiomata noted. No worrisome osseous lesions. No abnormal marrow edema. Conus medullaris and cauda equina: Conus extends to the T12-L1 level. Conus and cauda equina appear normal. Paraspinal and other soft tissues: Paraspinous soft tissues within normal limits. Small approximate 1 cm simple cyst noted within the right kidney, benign in appearance, no follow-up imaging recommended. Visualized visceral structures otherwise unremarkable. Disc levels: L1-2:  Unremarkable. L2-3:  Unremarkable. L3-4:  Unremarkable. L4-5: Disc desiccation with mild disc bulge. Superimposed tiny central disc protrusion with slight inferior migration and annular fissure. Mild facet hypertrophy. No significant canal or lateral recess stenosis. Foramina remain patent. L5-S1: Degenerative intervertebral disc space narrowing with diffuse disc bulge and disc desiccation. Superimposed left subarticular disc protrusion impinges upon the descending left S1 nerve root in the left lateral recess (series 8, image 32). Superimposed mild left-sided facet hypertrophy. Moderate left with mild right lateral recess stenosis. Central canal remains patent. No significant foraminal narrowing. IMPRESSION: 1. Left subarticular disc protrusion at L5-S1, impinging upon the descending left S1 nerve root in the left lateral recess. 2. Mild degenerative disc disease and facet hypertrophy at  L4-5 without significant stenosis or impingement. Electronically Signed   By: Rise Mu M.D.   On: 10/16/2021 04:43     Assessment/Plan There are no diagnoses linked to this encounter.   Levora Dredge, MD  10/24/2021 12:24 PM

## 2021-10-24 NOTE — Assessment & Plan Note (Signed)
Chronic, ongoing.  Denis SI/HI today, but has had fleeting thoughts with no plans.  Safety plan in place and aware to go to ER immediately if plan presents.  Discontinued Sertraline.  Increase Seroquel to 150 MG for sleep and mood.  Continue this and adjust as needed, wishes to lose weight -- may benefit from time off Seroquel in future to assist with weight loss -- Seroquel ordered by neuro at baseline. ?more Bipolar or other mood disorder as multiple positives on MDQ (will scan into chart).  Scheduled to see psychiatry upcoming which will offer benefit, appreciate their input.  Return in August.

## 2021-10-25 ENCOUNTER — Encounter (INDEPENDENT_AMBULATORY_CARE_PROVIDER_SITE_OTHER): Payer: Self-pay | Admitting: Vascular Surgery

## 2021-10-25 ENCOUNTER — Ambulatory Visit (INDEPENDENT_AMBULATORY_CARE_PROVIDER_SITE_OTHER): Payer: No Typology Code available for payment source | Admitting: Vascular Surgery

## 2021-10-25 VITALS — BP 118/80 | HR 85 | Resp 16 | Wt 218.0 lb

## 2021-10-25 DIAGNOSIS — I10 Essential (primary) hypertension: Secondary | ICD-10-CM

## 2021-10-25 DIAGNOSIS — E7800 Pure hypercholesterolemia, unspecified: Secondary | ICD-10-CM

## 2021-10-25 DIAGNOSIS — I872 Venous insufficiency (chronic) (peripheral): Secondary | ICD-10-CM

## 2021-10-28 ENCOUNTER — Encounter (INDEPENDENT_AMBULATORY_CARE_PROVIDER_SITE_OTHER): Payer: Self-pay | Admitting: Vascular Surgery

## 2021-11-01 ENCOUNTER — Other Ambulatory Visit: Payer: Self-pay

## 2021-11-01 MED ORDER — LORAZEPAM 0.5 MG PO TABS
ORAL_TABLET | ORAL | 3 refills | Status: DC
  Filled 2021-11-01: qty 20, 10d supply, fill #0
  Filled 2021-12-17: qty 20, 10d supply, fill #1
  Filled 2022-02-04: qty 20, 10d supply, fill #2
  Filled 2022-03-07: qty 20, 10d supply, fill #3

## 2021-11-07 ENCOUNTER — Other Ambulatory Visit: Payer: Self-pay

## 2021-11-08 NOTE — Progress Notes (Signed)
Referring Physician:  Marjie Skiff, NP [address] [place],  [place] [address]  Primary Physician:  Marjie Skiff, NP  History of Present Illness: 11/09/2021 Ms. Briana Ward is here today with a chief complaint of chronic low back pain that radiates into the left leg. Along with some numbness in the toes.   She has been having issues for approximate 2.5 months.  She has some pain on her right leg, but her left leg pain is now the predominant factor for her.  She has sharp, dull, aching, and burning pain down her left leg.  This is made worse by standing and leaning towards the left side, sneezing, and sitting.  Nothing really helps.  Her pain can be as bad as 10 out of 10.   Bowel/Bladder Dysfunction: none  Conservative measures:  Physical therapy:  has not participated in Multimodal medical therapy including regular antiinflammatories:  gabapentin, naproxen, hydrocodone, prednisone  Injections: has had epidural steroid injections 11/05/21: Left S1 TF ESI (no relief)  Past Surgery:  denies   Briana Ward has no symptoms of cervical myelopathy.  The symptoms are causing a significant impact on the patient's life.   Review of Systems:  A 10 point review of systems is negative, except for the pertinent positives and negatives detailed in the HPI.  Past Medical History: Past Medical History:  Diagnosis Date   Acne    Anxiety    Increased BMI    Irregular menses    Monilial vulvovaginitis    Seizure disorder (HCC)    Stroke [facility])     Past Surgical History: Past Surgical History:  Procedure Laterality Date   CESAREAN SECTION     CHOLECYSTECTOMY     TONSILLECTOMY AND ADENOIDECTOMY      Allergies: Allergies as of 11/09/2021 - Review Complete 11/09/2021  Allergen Reaction Noted   Aloe vera Cough 08/01/2021   Lisinopril Cough 10/18/2019   Penicillins  03/08/2015    Medications: Current Meds  Medication Sig   gabapentin (NEURONTIN) 300 MG capsule Take  one capsule by mouth at bedtime for 4 days, then 1 capsule twice a day for 4 days, then 1 capsule 3 times a day   lamoTRIgine (LAMICTAL) 200 MG tablet TAKE 2 TABLETS (400 MG) BY MOUTH 2 TIMES DAILY   levonorgestrel (MIRENA) 20 MCG/24HR IUD 1 each by Intrauterine route once.   LORazepam (ATIVAN) 0.5 MG tablet Take 1 tablet (0.5 mg total) by mouth 2 (two) times daily as needed for Anxiety for up to 180 days   losartan (COZAAR) 25 MG tablet TAKE 1 TABLET BY MOUTH ONCE DAILY   QUEtiapine (SEROQUEL) 100 MG tablet Take 1.5 tablets (150 mg total) by mouth at bedtime.   Semaglutide-Weight Management (WEGOVY) 1 MG/0.5ML SOAJ Inject 1 mg into the skin once a week.   silver sulfADIAZINE (SILVADENE) 1 % cream Apply 1 application. topically daily.    Social History: Social History   Tobacco Use   Smoking status: Never   Smokeless tobacco: Never  Substance Use Topics   Alcohol use: No   Drug use: No    Family Medical History: Family History  Problem Relation Age of Onset   Heart disease Mother    Diabetes Maternal Aunt    Breast cancer Maternal Aunt    Osteoporosis Maternal Grandmother    Colon cancer Neg Hx    Ovarian cancer Neg Hx     Physical Examination: Vitals:   11/09/21 0904  BP: 130/82  General: Patient is well developed, well nourished, calm, collected, and in no apparent distress. Attention to examination is appropriate.  Psychiatric: Patient is non-anxious.  Head:  Pupils equal, round, and reactive to light.  ENT:  Oral mucosa appears well hydrated.  Neck:   Supple.  Full range of motion.  Respiratory: Patient is breathing without any difficulty.  Extremities: No edema.  Vascular: Palpable dorsal pedal pulses.  Skin:   On exposed skin, there are no abnormal skin lesions.  NEUROLOGICAL:     Awake, alert, oriented to person, place, and time.  Speech is clear and fluent. Fund of knowledge is appropriate.   Cranial Nerves: Pupils equal round and reactive to  light.  Facial tone is symmetric.  Facial sensation is symmetric. Shoulder shrug is symmetric. Tongue protrusion is midline.  There is no pronator drift.  Straight leg raise is positive at 45 degrees on the left  Strength: Side Biceps Triceps Deltoid Interossei Grip Wrist Ext. Wrist Flex.  R 5 5 5 5 5 5 5   L 5 5 5 5 5 5 5    Side Iliopsoas Quads Hamstring PF DF EHL  R 5 5 5 5 5 5   L 5 5 5 5 5 5    Reflexes are 1+ and symmetric at the biceps, triceps, brachioradialis, patella and achilles.   Hoffman's is absent.  Clonus is not present.  Toes are down-going.  Bilateral upper and lower extremity sensation shows diminished sensation in the right leg diffusely due to congenital cerebrovascular accident .    No evidence of dysmetria noted.  Her gait is antalgic  Medical Decision Making  Imaging: MRI L spine 10/14/2021 IMPRESSION: 1. Left subarticular disc protrusion at L5-S1, impinging upon the descending left S1 nerve root in the left lateral recess. 2. Mild degenerative disc disease and facet hypertrophy at L4-5 without significant stenosis or impingement.     Electronically Signed   By: M.D.   On: 10/16/2021 04:43  I have personally reviewed the images and agree with the above interpretation.  Assessment and Plan: [name] is a pleasant 36 y.o. female with lumbar radiculopathy due to disc herniation at L5-S1.  She has no current warning signs.  I have recommended that she start with physical therapy and continue NSAIDs.  I suggested ibuprofen 600 mg 3 times daily.  We reviewed that narcotics are not particularly helpful for this condition.  If she does not have improvement with physical therapy or is discharged from physical therapy, we will discuss left-sided L5-S1 microdiscectomy.  I have asked her to notify me if she has any worsening of her condition or if she is discharged from physical therapy.  Otherwise I will see her back in approximately 6  weeks.   I spent a total of 30 minutes in face-to-face and non-face-to-face activities related to this patient's care today.   Thank you for involving me in the care of this patient.   This note was partially dictated using voice recognition software, so please excuse any errors that were not corrected.    Lakshmi Sundeen K. Rise Mu MD, [facility]

## 2021-11-09 ENCOUNTER — Encounter: Payer: Self-pay | Admitting: Neurosurgery

## 2021-11-09 ENCOUNTER — Ambulatory Visit: Payer: No Typology Code available for payment source | Admitting: Neurosurgery

## 2021-11-09 VITALS — BP 130/82 | Ht 64.0 in | Wt 216.0 lb

## 2021-11-09 DIAGNOSIS — M5416 Radiculopathy, lumbar region: Secondary | ICD-10-CM

## 2021-11-13 ENCOUNTER — Other Ambulatory Visit: Payer: Self-pay

## 2021-11-13 MED ORDER — IBUPROFEN 800 MG PO TABS
ORAL_TABLET | ORAL | 0 refills | Status: DC
  Filled 2021-11-13: qty 6, 2d supply, fill #0

## 2021-11-13 MED ORDER — MISOPROSTOL 200 MCG PO TABS
ORAL_TABLET | ORAL | 0 refills | Status: DC
  Filled 2021-11-13: qty 2, 2d supply, fill #0

## 2021-11-14 ENCOUNTER — Other Ambulatory Visit: Payer: Self-pay

## 2021-11-14 ENCOUNTER — Ambulatory Visit (INDEPENDENT_AMBULATORY_CARE_PROVIDER_SITE_OTHER): Payer: No Typology Code available for payment source | Admitting: Psychiatry

## 2021-11-14 ENCOUNTER — Encounter: Payer: Self-pay | Admitting: Psychiatry

## 2021-11-14 VITALS — BP 122/82 | HR 73 | Temp 98.5°F | Wt 219.4 lb

## 2021-11-14 DIAGNOSIS — F411 Generalized anxiety disorder: Secondary | ICD-10-CM

## 2021-11-14 DIAGNOSIS — F39 Unspecified mood [affective] disorder: Secondary | ICD-10-CM | POA: Insufficient documentation

## 2021-11-14 DIAGNOSIS — F439 Reaction to severe stress, unspecified: Secondary | ICD-10-CM

## 2021-11-14 DIAGNOSIS — M5416 Radiculopathy, lumbar region: Secondary | ICD-10-CM

## 2021-11-14 MED ORDER — DULOXETINE HCL 20 MG PO CPEP
20.0000 mg | ORAL_CAPSULE | Freq: Every day | ORAL | 1 refills | Status: DC
  Filled 2021-11-14: qty 30, 30d supply, fill #0
  Filled 2021-12-17: qty 30, 30d supply, fill #1
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
## 2021-11-14 NOTE — Patient Instructions (Signed)
[email or web address]  [email or web address]  Oscar La - [phone] Duloxetine Delayed-Release Capsules What is this medication? DULOXETINE (doo LOX e teen) treats depression, anxiety, fibromyalgia, and certain types of chronic pain such as nerve, bone, or joint pain. It increases the amount of serotonin and norepinephrine in the brain, hormones that help regulate mood and pain. It belongs to a group of medications called SNRIs. This medicine may be used for other purposes; ask your health care provider or pharmacist if you have questions. COMMON BRAND NAME(S): Cymbalta, Drizalma, Irenka What should I tell my care team before I take this medication? They need to know if you have any of these conditions: Bipolar disorder Glaucoma High blood pressure Kidney disease Liver disease Seizures Suicidal thoughts, plans or attempt; a previous suicide attempt by you or a family member Take medications that treat or prevent blood clots Taken medications called MAOIs like Carbex, Eldepryl, Marplan, Nardil, and Parnate within 14 days Trouble passing urine An unusual reaction to duloxetine, other medications, foods, dyes, or preservatives Pregnant or trying to get pregnant Breast-feeding How should I use this medication? Take this medication by mouth with a glass of water. Follow the directions on the prescription label. Do not crush, cut or chew some capsules of this medication. Some capsules may be opened and sprinkled on applesauce. Check with your care team or pharmacist if you are not sure. You can take this medication with or without food. Take your medication at regular intervals. Do not take your medication more often than directed. Do not stop taking this medication suddenly except upon the advice of your care team. Stopping this medication too quickly may cause serious side effects or your condition may worsen. A special MedGuide will be given to you by the pharmacist with each  prescription and refill. Be sure to read this information carefully each time. Talk to your care team regarding the use of this medication in children. While this medication may be prescribed for children as young as 79 years of age for selected conditions, precautions do apply. Overdosage: If you think you have taken too much of this medicine contact a poison control center or emergency room at once. NOTE: This medicine is only for you. Do not share this medicine with others. What if I miss a dose? If you miss a dose, take it as soon as you can. If it is almost time for your next dose, take only that dose. Do not take double or extra doses. What may interact with this medication? Do not take this medication with any of the following: Desvenlafaxine Levomilnacipran Linezolid MAOIs like Carbex, Eldepryl, Emsam, Marplan, Nardil, and Parnate Methylene blue (injected into a vein) Milnacipran Safinamide Thioridazine Venlafaxine Viloxazine This medication may also interact with the following: Alcohol Amphetamines Aspirin and aspirin-like medications Certain antibiotics like ciprofloxacin and enoxacin Certain medications for blood pressure, heart disease, irregular heart beat Certain medications for depression, anxiety, or psychotic disturbances Certain medications for migraine headache like almotriptan, eletriptan, frovatriptan, naratriptan, rizatriptan, sumatriptan, zolmitriptan Certain medications that treat or prevent blood clots like warfarin, enoxaparin, and dalteparin Cimetidine Fentanyl Lithium NSAIDS, medications for pain and inflammation, like ibuprofen or naproxen Phentermine Procarbazine Rasagiline Sibutramine St. John's wort Theophylline Tramadol Tryptophan This list may not describe all possible interactions. Give your health care provider a list of all the medicines, herbs, non-prescription drugs, or dietary supplements you use. Also tell them if you smoke, drink  alcohol, or use illegal drugs. Some items may interact with your medicine. What should I  watch for while using this medication? Tell your care team if your symptoms do not get better or if they get worse. Visit your care team for regular checks on your progress. Because it may take several weeks to see the full effects of this medication, it is important to continue your treatment as prescribed by your care team. This medication may cause serious skin reactions. They can happen weeks to months after starting the medication. Contact your care team right away if you notice fevers or flu-like symptoms with a rash. The rash may be red or purple and then turn into blisters or peeling of the skin. Or, you might notice a red rash with swelling of the face, lips, or lymph nodes in your neck or under your arms. Watch for new or worsening thoughts of suicide or depression. This includes sudden changes in mood, behaviors, or thoughts. These changes can happen at any time but are more common in the beginning of treatment or after a change in dose. Call your care team right away if you experience these thoughts or worsening depression. Manic episodes may happen in patients with bipolar disorder who take this medication. Watch for changes in feelings or behaviors such as feeling anxious, nervous, agitated, panicky, irritable, hostile, aggressive, impulsive, severely restless, overly excited and hyperactive, or trouble sleeping. These symptoms can happen at any time, but are more common in the beginning of treatment or after a change in dose. Call your care team right away if you notice any of these symptoms. You may get drowsy or dizzy. Do not drive, use machinery, or do anything that needs mental alertness until you know how this medication affects you. Do not stand or sit up quickly, especially if you are an older patient. This reduces the risk of dizzy or fainting spells. Alcohol may interfere with the effect of this  medication. Avoid alcoholic drinks. This medication may increase blood sugar. The risk may be higher in patients who already have diabetes. Ask your care team what you can do to lower your risk of diabetes while taking this medication. This medication can cause an increase in blood pressure. This medication can also cause a sudden drop in your blood pressure, which may make you feel faint and increase the chance of a fall. These effects are most common when you first start the medication or when the dose is increased, or during use of other medications that can cause a sudden drop in blood pressure. Check with your care team for instructions on monitoring your blood pressure while taking this medication. Your mouth may get dry. Chewing sugarless gum or sucking hard candy, and drinking plenty of water, may help. Contact your care team if the problem does not go away or is severe. What side effects may I notice from receiving this medication? Side effects that you should report to your care team as soon as possible: Allergic reactions--skin rash, itching, hives, swelling of the face, lips, tongue, or throat Bleeding--bloody or black, tar-like stools, red or dark brown urine, vomiting blood or brown material that looks like coffee grounds, small, red or purple spots on skin, unusual bleeding or bruising Increase in blood pressure Liver injury--right upper belly pain, loss of appetite, nausea, light-colored stool, dark yellow or brown urine, yellowing skin or eyes, unusual weakness or fatigue Low sodium level--muscle weakness, fatigue, dizziness, headache, confusion Redness, blistering, peeling, or loosening of the skin, including inside the mouth Serotonin syndrome--irritability, confusion, fast or irregular heartbeat, muscle stiffness, twitching  muscles, sweating, high fever, seizures, chills, vomiting, diarrhea Sudden eye pain or change in vision such as blurry vision, seeing halos around lights, vision  loss Thoughts of suicide or self-harm, worsening mood, feelings of depression Trouble passing urine Side effects that usually do not require medical attention (report to your care team if they continue or are bothersome): Change in sex drive or performance Constipation Diarrhea Dizziness Dry mouth Excessive sweating Loss of appetite Nausea Vomiting This list may not describe all possible side effects. Call your doctor for medical advice about side effects. You may report side effects to FDA at 1-800-FDA-1088. Where should I keep my medication? Keep out of the reach of children and pets. Store at room temperature between 15 and 30 degrees C (59 to 86 degrees F). Get rid of any unused medication after the expiration date. To get rid of medications that are no longer needed or have expired: Take the medication to a medication take-back program. Check with your pharmacy or law enforcement to find a location. If you cannot return the medication, check the label or package insert to see if the medication should be thrown out in the garbage or flushed down the toilet. If you are not sure, ask your care team. If it is safe to put it in the trash, take the medication out of the container. Mix the medication with cat litter, dirt, coffee grounds, or other unwanted substance. Seal the mixture in a bag or container. Put it in the trash. NOTE: This sheet is a summary. It may not cover all possible information. If you have questions about this medicine, talk to your doctor, pharmacist, or health care provider.  2023 Elsevier/Gold Standard (2020-05-02 00:00:00)

## 2021-11-15 ENCOUNTER — Encounter: Payer: Self-pay | Admitting: Psychiatry

## 2021-11-19 LAB — HM PAP SMEAR

## 2021-12-04 ENCOUNTER — Telehealth: Payer: Self-pay

## 2021-12-04 DIAGNOSIS — M5416 Radiculopathy, lumbar region: Secondary | ICD-10-CM

## 2021-12-04 NOTE — Telephone Encounter (Addendum)
I spok with Briana Ward. She does not have short term disability at this time, so she has elected to wait until January for surgery. She has canceled her follow up appointment for now and will reach out closer to when she is ready to schedule surgery to come in for a follow up appointment.  (Plan: Left L5-S1 microdiscectomy for leg pain, CPT [phone])  She would like to know if Dr Myer Haff recommends anything for the pain in the meantime. She has already tried an injection (without relief), PT, gabapentin, naproxen, hydrocodone, prednisone.  (She is aware that Dr Jeannie Fend is out of the office this week)

## 2021-12-10 ENCOUNTER — Telehealth: Payer: Self-pay

## 2021-12-10 NOTE — Telephone Encounter (Signed)
I notified the patient that Dr Myer Haff said she can pick a tentative date in January, but that the date is not a guarantee this far out due to the call schedule and potential vacation. Dr Myer Haff recommended she try NSAIDS in the meantime. I also informed her that Dr Myer Haff would like to see her in December for a recheck, as she will need to get a new MRI prior to surgery because her MRI will be outdated.

## 2021-12-10 NOTE — Telephone Encounter (Signed)
Briana Ward contacted me this morning to inquire about a surgery date for January. She said she is available any date in January.  I informed her that I would discuss with Dr Myer Haff and get back to her.

## 2021-12-10 NOTE — Telephone Encounter (Signed)
I notified the patient that she can pick a tentative date in January, but that the date is not a guarantee this far out due to the call schedule and potential vacation.

## 2021-12-11 ENCOUNTER — Other Ambulatory Visit: Payer: Self-pay

## 2021-12-11 MED ORDER — IBUPROFEN 800 MG PO TABS
ORAL_TABLET | ORAL | 2 refills | Status: DC
  Filled 2021-12-11: qty 60, 20d supply, fill #0
  Filled 2022-01-03: qty 60, 20d supply, fill #1
  Filled 2022-03-07: qty 60, 20d supply, fill #2

## 2021-12-11 MED ORDER — TIZANIDINE HCL 4 MG PO TABS
ORAL_TABLET | ORAL | 5 refills | Status: DC
  Filled 2021-12-11: qty 90, 30d supply, fill #0
  Filled 2022-02-04: qty 90, 30d supply, fill #1
  Filled 2022-03-07: qty 90, 30d supply, fill #2

## 2021-12-12 NOTE — Telephone Encounter (Signed)
Pt would like to proceed with a tentative surgery date of 05/22/22. Recheck appt scheduled for 04/18/21. New MRI order has been placed to be completed in early December prior to appt with Dr Myer Haff.

## 2021-12-17 ENCOUNTER — Other Ambulatory Visit: Payer: Self-pay

## 2021-12-20 ENCOUNTER — Ambulatory Visit: Payer: No Typology Code available for payment source | Admitting: Neurosurgery

## 2021-12-24 ENCOUNTER — Ambulatory Visit (INDEPENDENT_AMBULATORY_CARE_PROVIDER_SITE_OTHER): Payer: No Typology Code available for payment source | Admitting: Psychiatry

## 2021-12-24 ENCOUNTER — Other Ambulatory Visit: Payer: Self-pay

## 2021-12-24 ENCOUNTER — Encounter: Payer: Self-pay | Admitting: Psychiatry

## 2021-12-24 VITALS — BP 154/98 | HR 69 | Temp 98.7°F | Wt 209.0 lb

## 2021-12-24 DIAGNOSIS — F439 Reaction to severe stress, unspecified: Secondary | ICD-10-CM | POA: Diagnosis not present

## 2021-12-24 DIAGNOSIS — F39 Unspecified mood [affective] disorder: Secondary | ICD-10-CM | POA: Diagnosis not present

## 2021-12-24 DIAGNOSIS — F411 Generalized anxiety disorder: Secondary | ICD-10-CM

## 2021-12-24 MED ORDER — DULOXETINE HCL 20 MG PO CPEP
20.0000 mg | ORAL_CAPSULE | Freq: Two times a day (BID) | ORAL | 1 refills | Status: DC
  Filled 2021-12-24: qty 30, 15d supply, fill #0

## 2021-12-24 MED ORDER — DULOXETINE HCL 20 MG PO CPEP
20.0000 mg | ORAL_CAPSULE | Freq: Two times a day (BID) | ORAL | 1 refills | Status: DC
  Filled 2021-12-24 – 2022-01-08 (×3): qty 60, 30d supply, fill #0

## 2021-12-24 NOTE — Progress Notes (Unsigned)
[facility] MD OP Progress Note  12/24/2021 4:43 PM Briana Ward  MRN:  [id number]  Chief Complaint:  Chief Complaint  Patient presents with   Follow-up: 36 year old Caucasian female who has a history of mood lability, GAD, trauma and stressor related disorder unspecified, presented for medication management.   HPI: Briana Ward is a 36 year old Caucasian female, employed, single, lives in [place], has a history of episodic mood disorder, GAD, trauma and stress related disorder unspecified, attention and concentration deficit, back pain, seizure disorder, hypertension was evaluated in office today.  Patient today reports she is currently doing better with regards to her mood on the Cymbalta.  She currently takes Cymbalta 20 mg.  Denies side effects.  Reports sleep at night is overall okay.  Continues to be compliant on the Seroquel.  Denies side effects to the Seroquel.  Reports she continues to have mood swings, irritability, although she may be improving.  Patient reports she continues to struggle with attention and focus deficit, had an appointment with Briana Ward attention specialist recently.  Was started on Qelbree, however reports side effects to it, stopped taking it.  She has upcoming appointment with Briana Ward attention specialist for medication readjustment.  Patient with history of seizure-like spells, continues to be on Lamictal.  Denies any recent seizures.  Patient denies any suicidality, homicidality or perceptual disturbances.  Patient reports work is going well, reports work-related stressors as better.  Patient denies any other concerns today.  Visit Diagnosis:    ICD-10-CM   1. Episodic mood disorder (HCC)  F39 DULoxetine (CYMBALTA) 20 MG capsule    DISCONTINUED: DULoxetine (CYMBALTA) 20 MG capsule    2. GAD (generalized anxiety disorder)  F41.1 DULoxetine (CYMBALTA) 20 MG capsule    DISCONTINUED: DULoxetine (CYMBALTA) 20 MG capsule    3. Trauma and  stressor-related disorder  F43.9       Past Psychiatric History: Reviewed past psychiatric history from progress note on 11/14/2021.  Past trials of medications like BuSpar, Lexapro-tried it for a year-ineffective, Zoloft-made her worse, Qelbree-side effects.  Past Medical History:  Past Medical History:  Diagnosis Date   Acne    Anxiety    Increased BMI    Irregular menses    Monilial vulvovaginitis    Seizure disorder (HCC)    Stroke [facility])     Past Surgical History:  Procedure Laterality Date   CESAREAN SECTION     CHOLECYSTECTOMY     TONSILLECTOMY AND ADENOIDECTOMY      Family Psychiatric History: Reviewed family psychiatric history from progress note on 11/14/2021.  Family History:  Family History  Problem Relation Age of Onset   Heart disease Mother    Diabetes Maternal Aunt    Breast cancer Maternal Aunt    Osteoporosis Maternal Grandmother    Bipolar disorder Other    Colon cancer Neg Hx    Ovarian cancer Neg Hx     Social History: Reviewed social history from progress note on 11/14/2021. Social History   Socioeconomic History   Marital status: Single    Spouse name: Not on file   Number of children: 1   Years of education: Not on file   Highest education level: Associate degree: occupational, Scientist, product/process development, or vocational program  Occupational History   Not on file  Tobacco Use   Smoking status: Never   Smokeless tobacco: Never  Vaping Use   Vaping Use: Never used  Substance and Sexual Activity   Alcohol use: No   Drug use: No  Sexual activity: Yes    Birth control/protection: I.U.D.  Other Topics Concern   Not on file  Social History Narrative   Not on file   Social Determinants of Health   Financial Resource Strain: Not on file  Food Insecurity: Not on file  Transportation Needs: No Transportation Needs (08/01/2021)   PRAPARE - Administrator, Civil Service (Medical): No    Lack of Transportation (Non-Medical): No  Physical  Activity: Insufficiently Active (08/01/2021)   Exercise Vital Sign    Days of Exercise per Week: 3 days    Minutes of Exercise per Session: 20 min  Stress: Not on file  Social Connections: Not on file    Allergies:  Allergies  Allergen Reactions   Aloe Vera Cough   Lisinopril Cough   Penicillins     Metabolic Disorder Labs: Lab Results  Component Value Date   HGBA1C 4.7 (L) 08/29/2021   No results found for: "PROLACTIN" Lab Results  Component Value Date   CHOL 184 08/29/2021   TRIG 220 (H) 08/29/2021   HDL 42 08/29/2021   LDLCALC 104 (H) 08/29/2021   Lab Results  Component Value Date   TSH 2.070 08/29/2021    Therapeutic Level Labs: No results found for: "LITHIUM" No results found for: "VALPROATE" No results found for: "CBMZ"  Current Medications: Current Outpatient Medications  Medication Sig Dispense Refill   ibuprofen (ADVIL) 800 MG tablet Take 1 tablet (800 mg total) by mouth every 8 (eight) hours as needed for Pain Tid prn with food 60 tablet 2   lamoTRIgine (LAMICTAL) 200 MG tablet TAKE 2 TABLETS (400 MG) BY MOUTH 2 TIMES DAILY (Patient taking differently: Take 200 mg by mouth 2 (two) times daily. Takes differently) 360 tablet 3   levonorgestrel (MIRENA) 20 MCG/24HR IUD 1 each by Intrauterine route once.     losartan (COZAAR) 25 MG tablet TAKE 1 TABLET BY MOUTH ONCE DAILY 90 tablet 4   QUEtiapine (SEROQUEL) 100 MG tablet Take 1.5 tablets (150 mg total) by mouth at bedtime. 135 tablet 4   tiZANidine (ZANAFLEX) 4 MG tablet 1/2-1 tid prn muscle spasm 90 tablet 5   DULoxetine (CYMBALTA) 20 MG capsule Take 1 capsule (20 mg total) by mouth 2 (two) times daily. 60 capsule 1   LORazepam (ATIVAN) 0.5 MG tablet Take 1 tablet (0.5 mg total) by mouth 2 (two) times daily as needed for Anxiety for up to 180 days (Patient not taking: Reported on 12/24/2021) 20 tablet 3   misoprostol (CYTOTEC) 200 MCG tablet Take 1 tablet (200 mcg total) by mouth once for 1 dose Take one tablet  8pm the night before the procedure. Then take 1 tablet the morning of the procedure (Patient not taking: Reported on 12/24/2021) 2 tablet 0   No current facility-administered medications for this visit.     Musculoskeletal: Strength & Muscle Tone: within normal limits Gait & Station: normal Patient leans: N/A  Psychiatric Specialty Exam: Review of Systems  Psychiatric/Behavioral:  Positive for decreased concentration and dysphoric mood. The patient is nervous/anxious.   All other systems reviewed and are negative.   Blood pressure (!) 154/98, pulse 69, temperature 98.7 F (37.1 C), temperature source Temporal, weight 209 lb (94.8 kg).Body mass index is 35.87 kg/m.  General Appearance: Casual  Eye Contact:  Fair  Speech:  Clear and Coherent  Volume:  Normal  Mood:  Anxious and Depressed  Affect:  Congruent  Thought Process:  Goal Directed and Descriptions of Associations: Intact  Orientation:  Full (Time, Place, and Person)  Thought Content: Logical   Suicidal Thoughts:  No  Homicidal Thoughts:  No  Memory:  Immediate;   Fair Recent;   Fair Remote;   Fair  Judgement:  Fair  Insight:  Fair  Psychomotor Activity:  Normal  Concentration:  Concentration: Fair and Attention Span: Fair  Recall:  [employer] of Knowledge: Fair  Language: Fair  Akathisia:  No  Handed:  Right  AIMS (if indicated): done  Assets:  Communication Skills Desire for Improvement Housing Social Support  ADL's:  Intact  Cognition: WNL  Sleep:  Fair   Screenings: GAD-7    Flowsheet Row Office Visit from 12/24/2021 in [facility] Office Visit from 11/14/2021 in [facility] Office Visit from 10/24/2021 in [facility] Office Visit from 09/18/2021 in [facility] Office Visit from 08/29/2021 in [facility]  Total GAD-7 Score 15 19 13 16 11       PHQ2-9    Flowsheet Row Office Visit from 12/24/2021 in [facility] Office Visit from 11[facility]14/2021 in [facility] Office Visit from 10/24/2021 in [facility] Office Visit from 09/18/2021 in [facility] Office Visit from 08/29/2021 in [facility]  PHQ-2 Total Score 5 6 4 4 1   PHQ-9 Total Score 12 20 12 12 6       Flowsheet Row Office Visit from 12/24/2021 in [facility] Office Visit from 11/14/2021 in [facility]  C-SSRS RISK CATEGORY Low Risk Low Risk        Assessment and Plan: CINDE EBERT is a 36 year old Caucasian female, single, employed, lives in [place], has a history of seizure disorder, hypertension, episodic mood disorder, anxiety was evaluated in office today.  Patient with continued mood lability, will benefit from medication readjustment.  Plan as noted below.  Plan Episodic mood disorder-rule out borderline personality disorder versus bipolar disorder-unstable Continue Seroquel 150 mg p.o. nightly Continue CBT/DBT-currently follows up with Ms. Burnell Blanks.  Will coordinate care.  GAD-improving Increase Cymbalta to 20 mg p.o. twice daily Continue CBT  Trauma and stress related disorder-unstable Rule out PTSD-continue CBT  Attention and focus deficit - unstable Patient had recent ADHD testing-will need to request medical records from [facility] specialist. Patient has upcoming follow-up scheduled with them.  Patient with elevated blood pressure reading-we will need to follow up with primary care provider.  Follow-up in clinic in 4 weeks or sooner if needed.  This note was generated in part or whole with voice recognition software. Voice recognition is usually quite accurate but there are transcription errors that can and very often do occur. I apologize for any typographical errors that were not detected and corrected.     [place], MD 12/24/2021, 4:43 PM

## 2021-12-26 ENCOUNTER — Ambulatory Visit: Payer: No Typology Code available for payment source | Admitting: Nurse Practitioner

## 2021-12-27 ENCOUNTER — Ambulatory Visit: Payer: No Typology Code available for payment source | Admitting: Psychiatry

## 2022-01-03 ENCOUNTER — Other Ambulatory Visit: Payer: Self-pay

## 2022-01-08 ENCOUNTER — Telehealth: Payer: Self-pay

## 2022-01-08 ENCOUNTER — Other Ambulatory Visit: Payer: Self-pay

## 2022-01-08 DIAGNOSIS — F411 Generalized anxiety disorder: Secondary | ICD-10-CM

## 2022-01-08 DIAGNOSIS — F39 Unspecified mood [affective] disorder: Secondary | ICD-10-CM

## 2022-01-08 MED ORDER — DULOXETINE HCL 20 MG PO CPEP
40.0000 mg | ORAL_CAPSULE | Freq: Every day | ORAL | 1 refills | Status: DC
  Filled 2022-01-08: qty 30, 30d supply, fill #0
  Filled 2022-02-04: qty 60, 30d supply, fill #0
  Filled 2022-03-07: qty 60, 30d supply, fill #1

## 2022-01-08 NOTE — Telephone Encounter (Signed)
pt called states that her insurance will not cover the 20mg  x 2 to = 40mg  they will cover the 40mg  .  please send in a cymbalta 40mg 

## 2022-01-08 NOTE — Telephone Encounter (Signed)
I have sent 40 mg-duloxetine to pharmacy.

## 2022-01-09 NOTE — Telephone Encounter (Signed)
Left message that rx was sent to pharmacy 

## 2022-02-04 ENCOUNTER — Other Ambulatory Visit: Payer: Self-pay

## 2022-02-04 ENCOUNTER — Other Ambulatory Visit: Payer: Self-pay | Admitting: Nurse Practitioner

## 2022-02-04 ENCOUNTER — Encounter: Payer: Self-pay | Admitting: Nurse Practitioner

## 2022-02-04 MED ORDER — QUETIAPINE FUMARATE 100 MG PO TABS
150.0000 mg | ORAL_TABLET | Freq: Every day | ORAL | 4 refills | Status: DC
Start: 2022-02-04 — End: 2022-02-04

## 2022-02-04 MED ORDER — QUETIAPINE FUMARATE 100 MG PO TABS
150.0000 mg | ORAL_TABLET | Freq: Every day | ORAL | 4 refills | Status: DC
  Filled 2022-02-04: qty 135, 90d supply, fill #0
  Filled 2022-07-02: qty 135, 90d supply, fill #1

## 2022-02-04 NOTE — Telephone Encounter (Signed)
Medication refill for  last ov Seroquel 100 mg, 10/24/21  upcoming ov note noted at this time . Please advise

## 2022-02-07 ENCOUNTER — Other Ambulatory Visit: Payer: Self-pay

## 2022-02-07 ENCOUNTER — Encounter: Payer: Self-pay | Admitting: Psychiatry

## 2022-02-07 ENCOUNTER — Ambulatory Visit (INDEPENDENT_AMBULATORY_CARE_PROVIDER_SITE_OTHER): Payer: No Typology Code available for payment source | Admitting: Psychiatry

## 2022-02-07 VITALS — BP 125/80 | HR 80 | Temp 97.9°F | Wt 224.8 lb

## 2022-02-07 DIAGNOSIS — F411 Generalized anxiety disorder: Secondary | ICD-10-CM | POA: Diagnosis not present

## 2022-02-07 DIAGNOSIS — F439 Reaction to severe stress, unspecified: Secondary | ICD-10-CM

## 2022-02-07 DIAGNOSIS — Z9189 Other specified personal risk factors, not elsewhere classified: Secondary | ICD-10-CM | POA: Diagnosis not present

## 2022-02-07 DIAGNOSIS — F39 Unspecified mood [affective] disorder: Secondary | ICD-10-CM | POA: Diagnosis not present

## 2022-02-07 MED ORDER — QUETIAPINE FUMARATE 25 MG PO TABS
25.0000 mg | ORAL_TABLET | Freq: Every day | ORAL | 0 refills | Status: DC
  Filled 2022-02-07: qty 90, 90d supply, fill #0

## 2022-02-07 NOTE — Progress Notes (Signed)
[place] MD OP Progress Note  02/07/2022 1:58 PM Briana Ward  MRN:  [id number]  Chief Complaint:  Chief Complaint  Patient presents with   Follow-up   Anxiety    irritable   HPI: Briana Ward is a 36 year old Caucasian female, employed, single, lives in [place], has a history of episodic mood disorder, GAD, trauma and stress related disorder unspecified, attention and concentration deficit, chronic back pain, seizure disorder, hypertension was evaluated in office today.  Patient today reports she has noticed some difference on the higher dosage of Cymbalta.  She takes the Cymbalta 40 mg together at bedtime since it makes her drowsy when she takes it during the day.  Denies any other side effects.  Patient reports she continues to have irritability although it has not too overwhelming like it used to before.  She however feels she is irritable all the time.  He is able to function at work okay.  Patient reports she continues to have attention and focus problems, currently under the care of [facility] attention specialist.  Recently provided quelbree, had to stop it since she did not tolerate it.  Does have upcoming appointment for follow-up.  Patient reports sleep is overall okay.  Continues to have chronic back pain, may need back surgery scheduled in January 2024.  Does have support system from family and friends.  Patient denies any suicidality or homicidality.  Denies any perceptual disturbances.  Denies any other concerns today.    Visit Diagnosis:    ICD-10-CM   1. Episodic mood disorder (HCC)  F39 QUEtiapine (SEROQUEL) 25 MG tablet    2. GAD (generalized anxiety disorder)  F41.1 QUEtiapine (SEROQUEL) 25 MG tablet    3. Trauma and stressor-related disorder  F43.9 QUEtiapine (SEROQUEL) 25 MG tablet   Unspecified    4. At risk for prolonged QT interval syndrome  Z91.89 EKG 12-Lead      Past Psychiatric History: Reviewed past psychiatric history from progress note on  11/14/2021.  Past trials of medications like BuSpar, Lexapro-tried it for a year-ineffective, Zoloft-made her worse,Qelbree-side effects.  Past Medical History:  Past Medical History:  Diagnosis Date   Acne    Anxiety    Increased BMI    Irregular menses    Monilial vulvovaginitis    Seizure disorder ([place])    Stroke [facility])     Past Surgical History:  Procedure Laterality Date   CESAREAN SECTION     CHOLECYSTECTOMY     TONSILLECTOMY AND ADENOIDECTOMY      Family Psychiatric History: Reviewed family psychiatric history from progress note on 11/14/2021.  Family History:  Family History  Problem Relation Age of Onset   Heart disease Mother    Diabetes Maternal Aunt    Breast cancer Maternal Aunt    Osteoporosis Maternal Grandmother    Bipolar disorder Other    Colon cancer Neg Hx    Ovarian cancer Neg Hx     Social History: Reviewed social history from progress note on 11/14/2021. Social History   Socioeconomic History   Marital status: Single    Spouse name: Not on file   Number of children: 1   Years of education: Not on file   Highest education level: Associate degree: occupational, Hotel manager, or vocational program  Occupational History   Not on file  Tobacco Use   Smoking status: Never   Smokeless tobacco: Never  Vaping Use   Vaping Use: Never used  Substance and Sexual Activity   Alcohol use: No  Drug use: No   Sexual activity: Yes    Birth control/protection: I.U.D.  Other Topics Concern   Not on file  Social History Narrative   Not on file   Social Determinants of Health   Financial Resource Strain: Not on file  Food Insecurity: Not on file  Transportation Needs: No Transportation Needs (08/01/2021)   PRAPARE - Hydrologist (Medical): No    Lack of Transportation (Non-Medical): No  Physical Activity: Insufficiently Active (08/01/2021)   Exercise Vital Sign    Days of Exercise per Week: 3 days    Minutes of Exercise per  Session: 20 min  Stress: Not on file  Social Connections: Not on file    Allergies:  Allergies  Allergen Reactions   Aloe Vera Cough   Lisinopril Cough   Penicillins     Metabolic Disorder Labs: Lab Results  Component Value Date   HGBA1C 4.7 (L) 08/29/2021   No results found for: "PROLACTIN" Lab Results  Component Value Date   CHOL 184 08/29/2021   TRIG 220 (H) 08/29/2021   HDL 42 08/29/2021   LDLCALC 104 (H) 08/29/2021   Lab Results  Component Value Date   TSH 2.070 08/29/2021    Therapeutic Level Labs: No results found for: "LITHIUM" No results found for: "VALPROATE" No results found for: "CBMZ"  Current Medications: Current Outpatient Medications  Medication Sig Dispense Refill   DULoxetine (CYMBALTA) 20 MG capsule Take 2 capsules (40 mg total) by mouth daily. 60 capsule 1   ibuprofen (ADVIL) 800 MG tablet Take 1 tablet (800 mg total) by mouth every 8 (eight) hours as needed for Pain Tid prn with food 60 tablet 2   lamoTRIgine (LAMICTAL) 200 MG tablet TAKE 2 TABLETS (400 MG) BY MOUTH 2 TIMES DAILY (Patient taking differently: Take 200 mg by mouth 2 (two) times daily. Takes differently) 360 tablet 3   levonorgestrel (MIRENA) 20 MCG/24HR IUD 1 each by Intrauterine route once.     LORazepam (ATIVAN) 0.5 MG tablet Take 1 tablet (0.5 mg total) by mouth 2 (two) times daily as needed for Anxiety for up to 180 days 20 tablet 3   losartan (COZAAR) 25 MG tablet TAKE 1 TABLET BY MOUTH ONCE DAILY 90 tablet 4   QUEtiapine (SEROQUEL) 100 MG tablet Take 1.5 tablets (150 mg total) by mouth at bedtime. 135 tablet 4   QUEtiapine (SEROQUEL) 25 MG tablet Take 1 tablet (25 mg total) by mouth at bedtime. Take along with 150 mg at bedtime 90 tablet 0   tiZANidine (ZANAFLEX) 4 MG tablet 1/2-1 tid prn muscle spasm 90 tablet 5   No current facility-administered medications for this visit.     Musculoskeletal: Strength & Muscle Tone: within normal limits Gait & Station: normal Patient  leans: N/A  Psychiatric Specialty Exam: Review of Systems  Musculoskeletal:  Positive for back pain.  Psychiatric/Behavioral:  Positive for decreased concentration and sleep disturbance. The patient is nervous/anxious.   All other systems reviewed and are negative.   Blood pressure 125/80, pulse 80, temperature 97.9 F (36.6 C), temperature source Temporal, weight 224 lb 12.8 oz (102 kg).Body mass index is 38.59 kg/m.  General Appearance: Casual  Eye Contact:  Fair  Speech:  Clear and Coherent  Volume:  Normal  Mood:  Anxious and Irritable  Affect:  Congruent  Thought Process:  Goal Directed and Descriptions of Associations: Intact  Orientation:  Full (Time, Place, and Person)  Thought Content: Logical   Suicidal Thoughts:  No  Homicidal Thoughts:  No  Memory:  Immediate;   Fair Recent;   Fair Remote;   Fair  Judgement:  Fair  Insight:  Fair  Psychomotor Activity:  Normal  Concentration:  Concentration: Fair and Attention Span: Fair  Recall:  [employer] of Knowledge: Fair  Language: Fair  Akathisia:  No  Handed:  Right  AIMS (if indicated): done  Assets:  Communication Skills Desire for [place] Talents/Skills Transportation  ADL's:  Intact  Cognition: WNL  Sleep:  Fair   Screenings: [place] Office Visit from 02/07/2022 in [place] Office Visit from 12/24/2021 in [place] Total Score 0 0      GAD-7    [place] Visit from 02/07/2022 in [place] Visit from 12/24/2021 in [place] Visit from 11/14/2021 in [place] Visit from 10/24/2021 in [place] Visit from 09/18/2021 in [place]  Total GAD-7 Score 7 15 19 13 16       PHQ2-9    [place] Visit from 02/07/2022 in [place] Visit from 12/24/2021 in [place] Visit from 11/14/2021 in [place] Visit from 10/24/2021 in [place] Visit from 09/18/2021 in [place]  PHQ-2 Total Score 2 5 6 4 4   PHQ-9 Total Score 6 12 20 12 12       [place] Office Visit from 02/07/2022 in [place] Office Visit from 12/24/2021 in [place] Office Visit from 11/14/2021 in [place] No Risk Low Risk Low Risk        Assessment and Plan: Briana Ward is a 36 year old Caucasian female, single, employed, lives in [place], has a history of seizure disorder, hypertension, episodic mood disorder, anxiety was evaluated in office today.  Patient although improving continues to have irritability, will benefit from the following plan.  Plan Episodic mood disorder-rule out borderline personality disorder versus bipolar disorder-some improvement Increase Seroquel to 175 mg p.o. nightly Continue CBT/DBT with Eloisa [facility].  GAD-improving Cymbalta 40 mg p.o. daily Continue CBT  Trauma and stress related disorder-some improvement Rule out PTSD-continue CBT  Patient advised to sign a release to obtain medical records from [place] attention specialist.  At risk for prolonged QT syndrome-we will order EKG.  Follow-up in clinic in 6 weeks or sooner if needed.  This note was generated in part or whole with voice recognition software. Voice recognition is usually quite accurate but there are transcription errors that can and very often do occur. I apologize for any typographical errors that were not detected and corrected.       Ursula Alert, MD 02/08/2022, 1:31 PM

## 2022-03-04 ENCOUNTER — Encounter (INDEPENDENT_AMBULATORY_CARE_PROVIDER_SITE_OTHER): Payer: Self-pay

## 2022-03-07 ENCOUNTER — Other Ambulatory Visit: Payer: Self-pay

## 2022-03-08 ENCOUNTER — Other Ambulatory Visit: Payer: Self-pay

## 2022-03-08 MED ORDER — PREDNISONE 10 MG PO TABS
ORAL_TABLET | ORAL | 0 refills | Status: DC
  Filled 2022-03-08: qty 21, 6d supply, fill #0

## 2022-03-15 ENCOUNTER — Other Ambulatory Visit: Payer: Self-pay | Admitting: Physical Medicine & Rehabilitation

## 2022-03-15 DIAGNOSIS — M542 Cervicalgia: Secondary | ICD-10-CM

## 2022-03-20 ENCOUNTER — Other Ambulatory Visit: Payer: Self-pay

## 2022-03-22 ENCOUNTER — Ambulatory Visit
Admission: RE | Admit: 2022-03-22 | Discharge: 2022-03-22 | Disposition: A | Discharge status: Home / Self Care | Payer: No Typology Code available for payment source | Source: Ambulatory Visit | Attending: Physical Medicine & Rehabilitation | Admitting: Physical Medicine & Rehabilitation

## 2022-03-22 DIAGNOSIS — M542 Cervicalgia: Secondary | ICD-10-CM | POA: Diagnosis present

## 2022-04-04 ENCOUNTER — Ambulatory Visit (INDEPENDENT_AMBULATORY_CARE_PROVIDER_SITE_OTHER): Payer: No Typology Code available for payment source | Admitting: Psychiatry

## 2022-04-04 ENCOUNTER — Encounter: Payer: Self-pay | Admitting: Psychiatry

## 2022-04-04 ENCOUNTER — Other Ambulatory Visit: Payer: Self-pay

## 2022-04-04 VITALS — BP 128/86 | HR 70 | Temp 97.5°F | Ht 64.0 in | Wt 227.4 lb

## 2022-04-04 DIAGNOSIS — F39 Unspecified mood [affective] disorder: Secondary | ICD-10-CM | POA: Diagnosis not present

## 2022-04-04 DIAGNOSIS — F439 Reaction to severe stress, unspecified: Secondary | ICD-10-CM

## 2022-04-04 DIAGNOSIS — Z9189 Other specified personal risk factors, not elsewhere classified: Secondary | ICD-10-CM | POA: Diagnosis not present

## 2022-04-04 DIAGNOSIS — F411 Generalized anxiety disorder: Secondary | ICD-10-CM | POA: Diagnosis not present

## 2022-04-04 MED ORDER — DULOXETINE HCL 20 MG PO CPEP
40.0000 mg | ORAL_CAPSULE | Freq: Every day | ORAL | 1 refills | Status: DC
  Filled 2022-04-04: qty 60, 30d supply, fill #0
  Filled 2022-05-10: qty 60, 30d supply, fill #1

## 2022-04-04 NOTE — Progress Notes (Signed)
[facility] MD OP Progress Note  04/04/2022 5:16 PM Briana Ward  MRN:  [id number]  Chief Complaint:  Chief Complaint  Patient presents with   Follow-up   Irritable   Medication Refill   HPI: Briana Ward is a 36 year old Caucasian female, employed, single, lives in [place], has a history of episodic mood disorder, GAD, trauma and stress related unspecified, attention and concentration deficit, chronic back pain, seizure disorder, hypertension was evaluated in office today.  Patient today reports she has been irritable since the past 1 month or so.  She reports she was trying to get back surgery done in January and had applied for short-term disability.  She however reports her short-term disability was denied since they gave her reasons of ' depression, height and weight.'  Patient reports currently struggles with irritability due to her concern about her short-term disability denial.  She reports she continues to struggle with a lot of back pain which is a constant stressor for her.  There are days that she does not feel like doing anything mostly because of her pain and not due to her mood.  She reports the pain does make her depressed.  Patient reports sleep is good.  Once she is able to fall asleep she can sleep around 8 hours.  She takes Seroquel at night around 10 PM and is able to fall asleep by around 11 PM.  She is able to stay in bed at least until 7 AM.  Patient reports she may have gained a few pounds, unknown if this is due to her Seroquel.  Patient denies any suicidality, homicidality or perceptual disturbances.  Patient with history of attention and focus deficit, was under the care of [place] attention specialist.  Patient was advised to sign an ROI last visit to request medical records still pending.  Patient continues to follow-up with her therapist Ms. Elita Quickheryl Lawson.  Will coordinate care.  Denies any suicidality, homicidality or perceptual disturbances.  Patient  denies any other concerns today.  Visit Diagnosis:    ICD-10-CM   1. Episodic mood disorder (HCC)  F39 EKG 12-Lead    DULoxetine (CYMBALTA) 20 MG capsule    2. GAD (generalized anxiety disorder)  F41.1 DULoxetine (CYMBALTA) 20 MG capsule    3. Trauma and stressor-related disorder  F43.9    Unspecified    4. At risk for prolonged QT interval syndrome  Z91.89 EKG 12-Lead      Past Psychiatric History: Reviewed past psychiatric history from progress note on 11/14/2021.  Past trials of medications like BuSpar, Lexapro-tried it for a year-ineffective, Zoloft-made her worse,Qelbree-side effects.  Past Medical History:  Past Medical History:  Diagnosis Date   Acne    Anxiety    Increased BMI    Irregular menses    Monilial vulvovaginitis    Seizure disorder (HCC)    Stroke [facility](HCC)     Past Surgical History:  Procedure Laterality Date   CESAREAN SECTION     CHOLECYSTECTOMY     TONSILLECTOMY AND ADENOIDECTOMY      Family Psychiatric History: Reviewed family psychiatric history from progress note on 11/14/2021.  Family History:  Family History  Problem Relation Age of Onset   Heart disease Mother    Diabetes Maternal Aunt    Breast cancer Maternal Aunt    Osteoporosis Maternal Grandmother    Bipolar disorder Other    Colon cancer Neg Hx    Ovarian cancer Neg Hx     Social History: Reviewed social  history from progress note on 11/14/2021. Social History   Socioeconomic History   Marital status: Single    Spouse name: Not on file   Number of children: 1   Years of education: Not on file   Highest education level: Associate degree: occupational, Scientist, product/process development, or vocational program  Occupational History   Not on file  Tobacco Use   Smoking status: Never   Smokeless tobacco: Never  Vaping Use   Vaping Use: Never used  Substance and Sexual Activity   Alcohol use: No   Drug use: No   Sexual activity: Yes    Birth control/protection: I.U.D.  Other Topics Concern   Not on  file  Social History Narrative   Not on file   Social Determinants of Health   Financial Resource Strain: Not on file  Food Insecurity: Not on file  Transportation Needs: No Transportation Needs (08/01/2021)   PRAPARE - Administrator, Civil Service (Medical): No    Lack of Transportation (Non-Medical): No  Physical Activity: Insufficiently Active (08/01/2021)   Exercise Vital Sign    Days of Exercise per Week: 3 days    Minutes of Exercise per Session: 20 min  Stress: Not on file  Social Connections: Not on file    Allergies:  Allergies  Allergen Reactions   Aloe Vera Cough   Lisinopril Cough   Penicillins     Metabolic Disorder Labs: Lab Results  Component Value Date   HGBA1C 4.7 (L) 08/29/2021   No results found for: "PROLACTIN" Lab Results  Component Value Date   CHOL 184 08/29/2021   TRIG 220 (H) 08/29/2021   HDL 42 08/29/2021   LDLCALC 104 (H) 08/29/2021   Lab Results  Component Value Date   TSH 2.070 08/29/2021    Therapeutic Level Labs: No results found for: "LITHIUM" No results found for: "VALPROATE" No results found for: "CBMZ"  Current Medications: Current Outpatient Medications  Medication Sig Dispense Refill   ibuprofen (ADVIL) 800 MG tablet Take 1 tablet (800 mg total) by mouth every 8 (eight) hours as needed for Pain Tid prn with food 60 tablet 2   lamoTRIgine (LAMICTAL) 200 MG tablet TAKE 2 TABLETS (400 MG) BY MOUTH 2 TIMES DAILY (Patient taking differently: Take 200 mg by mouth 2 (two) times daily. Takes differently) 360 tablet 3   levonorgestrel (MIRENA) 20 MCG/24HR IUD 1 each by Intrauterine route once.     LORazepam (ATIVAN) 0.5 MG tablet Take 1 tablet (0.5 mg total) by mouth 2 (two) times daily as needed for Anxiety for up to 180 days 20 tablet 3   losartan (COZAAR) 25 MG tablet TAKE 1 TABLET BY MOUTH ONCE DAILY 90 tablet 4   QUEtiapine (SEROQUEL) 100 MG tablet Take 1.5 tablets (150 mg total) by mouth at bedtime. 135 tablet 4    QUEtiapine (SEROQUEL) 25 MG tablet Take 1 tablet (25 mg total) by mouth at bedtime. Take along with 150 mg at bedtime 90 tablet 0   tiZANidine (ZANAFLEX) 4 MG tablet 1/2-1 tid prn muscle spasm 90 tablet 5   DULoxetine (CYMBALTA) 20 MG capsule Take 2 capsules (40 mg total) by mouth daily. 60 capsule 1   No current facility-administered medications for this visit.     Musculoskeletal: Strength & Muscle Tone: within normal limits Gait & Station: normal Patient leans: N/A  Psychiatric Specialty Exam: Review of Systems  Musculoskeletal:  Positive for back pain.  Psychiatric/Behavioral:         Irritable  All  other systems reviewed and are negative.   Blood pressure 128/86, pulse 70, temperature (!) 97.5 F (36.4 C), temperature source Oral, height 5\' 4"  (1.626 m), weight 227 lb 6.4 oz (103.1 kg).Body mass index is 39.03 kg/m.  General Appearance: Casual  Eye Contact:  Fair  Speech:  Clear and Coherent  Volume:  Normal  Mood:  Irritable  Affect:  Congruent  Thought Process:  Goal Directed and Descriptions of Associations: Intact  Orientation:  Full (Time, Place, and Person)  Thought Content: Logical   Suicidal Thoughts:  No  Homicidal Thoughts:  No  Memory:  Immediate;   Fair Recent;   Fair Remote;   Fair  Judgement:  Fair  Insight:  Fair  Psychomotor Activity:  Normal  Concentration:  Concentration: Fair and Attention Span: Fair  Recall:  of Knowledge: Fair  Language: Fair  Akathisia:  No  Handed:  Right  AIMS (if indicated): done  Assets:  Communication Skills Desire for Improvement Housing Social Support Talents/Skills  ADL's:  Intact  Cognition: WNL  Sleep:  Fair   Screenings: AIMS    Flowsheet Row Office Visit from 02/07/2022 in [facility] Office Visit from 12/24/2021 in [facility]  AIMS Total Score 0 0      GAD-7    Flowsheet Row Office Visit from 04/04/2022 in [facility]  [facility] Office Visit from 02/07/2022 in [facility] Office Visit from 12/24/2021 in [facility] Office Visit from 11/14/2021 in [facility] Office Visit from 10/24/2021 in [facility]  Total GAD-7 Score 11 7 15 19 13       PHQ2-9    Flowsheet Row Office Visit from 04/04/2022 in [facility] Office Visit from 02/07/2022 in [facility] Office Visit from 12/24/2021 in [facility] Office Visit from 11/14/2021 in [facility] Office Visit from 10/24/2021 in [facility]  PHQ-2 Total Score 1 2 5 6 4   PHQ-9 Total Score 4 6 12 20 12       Flowsheet Row Office Visit from 04/04/2022 in [facility] Office Visit from 02/07/2022 in [facility] Office Visit from 12/24/2021 in [facility]  C-SSRS RISK CATEGORY No Risk No Risk Low Risk        Assessment and Plan: EDITHA BRIDGEFORTH is a 36 year old Caucasian female, single, employed, lives in [place], has a history of seizure disorder, hypertension, episodic mood disorder, chronic back pain was evaluated in office today.  Patient with current psychosocial stressors, was recently denied short-term disability for her back surgery coming up in January, does report irritability triggered by her situational stressors will benefit from the following plan.  Plan Episodic mood disorder-rule out borderline personality disorder-unstable Patient concerned about weight gain, and when it was discussed with patient side effects of Seroquel being weight gain, agreeable to taper off the Seroquel. She is currently on Lamictal which she takes for seizures, it is also a mood stabilizer. Continue Cymbalta 40 mg p.o. daily.  Could readjust the dosage of Cymbalta in  the future. Patient advised to continue CBT/DBT with Briana Blanks -therapist. Will coordinate care, patient advised to sign an ROI-phone number to call-[phone] per patient report.  GAD-unstable Patient to continue CBT. Cymbalta 40 mg p.o. daily for now.  Could readjust this dosage in the future  Trauma and stress related disorder-improving Rule out PTSD-continue CBT  At risk for  prolonged QT syndrome-we will order EKG.  Patient to contact [phone] for an appointment for EKG. Once I review EKG will consider tapering patient off of the Seroquel and adding doxepin for sleep.  Will consider another mood stabilizer as needed in the future.  Follow-up in clinic in 4 weeks or sooner if needed.    This note was generated in part or whole with voice recognition software. Voice recognition is usually quite accurate but there are transcription errors that can and very often do occur. I apologize for any typographical errors that were not detected and corrected.   Addendum-5:16 PM-I have left a message for Ms. Elnita Maxwell Lawson-therapist to coordinate care.       Jomarie Longs, MD 04/04/2022, 5:16 PM

## 2022-04-04 NOTE — Patient Instructions (Signed)
Please call for EKG-[phone] 

## 2022-04-08 ENCOUNTER — Other Ambulatory Visit: Payer: Self-pay

## 2022-04-08 DIAGNOSIS — Z01818 Encounter for other preprocedural examination: Secondary | ICD-10-CM

## 2022-04-11 ENCOUNTER — Ambulatory Visit
Admission: RE | Admit: 2022-04-11 | Discharge: 2022-04-11 | Disposition: A | Discharge status: Home / Self Care | Payer: No Typology Code available for payment source | Source: Ambulatory Visit | Attending: Neurosurgery | Admitting: Neurosurgery

## 2022-04-11 DIAGNOSIS — M5416 Radiculopathy, lumbar region: Secondary | ICD-10-CM | POA: Diagnosis present

## 2022-04-18 ENCOUNTER — Ambulatory Visit (INDEPENDENT_AMBULATORY_CARE_PROVIDER_SITE_OTHER): Payer: No Typology Code available for payment source | Admitting: Neurosurgery

## 2022-04-18 ENCOUNTER — Encounter: Payer: Self-pay | Admitting: Neurosurgery

## 2022-04-18 VITALS — BP 126/82 | Ht 64.0 in | Wt 227.0 lb

## 2022-04-18 DIAGNOSIS — M5416 Radiculopathy, lumbar region: Secondary | ICD-10-CM

## 2022-04-18 NOTE — Progress Notes (Signed)
Referring Physician:  Marjie Skiff, NP [address] [place],  [place] [address]  Primary Physician:  Marjie Skiff, NP  History of Present Illness: 04/18/2022 Ms. Danzey returns to see me.  Her pain has gotten much better.  She has been on duloxetine for the last 2 weeks with significant improvement.  She still has some pain in her posterior buttock on the left side, but it is not nearly as severe as it was previously.   11/09/2021 Ms. Tashenna Wellbaum is here today with a chief complaint of chronic low back pain that radiates into the left leg. Along with some numbness in the toes.   She has been having issues for approximate 2.5 months.  She has some pain on her right leg, but her left leg pain is now the predominant factor for her.  She has sharp, dull, aching, and burning pain down her left leg.  This is made worse by standing and leaning towards the left side, sneezing, and sitting.  Nothing really helps.  Her pain can be as bad as 10 out of 10.   Bowel/Bladder Dysfunction: none  Conservative measures:  Physical therapy:  has not participated in Multimodal medical therapy including regular antiinflammatories:  gabapentin, naproxen, hydrocodone, prednisone  Injections: has had epidural steroid injections 11/05/21: Left S1 TF ESI (no relief)  Past Surgery:  denies   LEMOYNE MCMATH has no symptoms of cervical myelopathy.  The symptoms are causing a significant impact on the patient's life.   Review of Systems:  A 10 point review of systems is negative, except for the pertinent positives and negatives detailed in the HPI.  Past Medical History: Past Medical History:  Diagnosis Date   Acne    Anxiety    Increased BMI    Irregular menses    Monilial vulvovaginitis    Seizure disorder (HCC)    Stroke [facility])     Past Surgical History: Past Surgical History:  Procedure Laterality Date   CESAREAN SECTION     CHOLECYSTECTOMY     TONSILLECTOMY AND ADENOIDECTOMY       Allergies: Allergies as of 04/18/2022 - Review Complete 04/18/2022  Allergen Reaction Noted   Aloe vera Cough 08/01/2021   Lisinopril Cough 10/18/2019   Penicillins Rash 03/08/2015    Medications: Current Meds  Medication Sig   DULoxetine (CYMBALTA) 20 MG capsule Take 2 capsules (40 mg total) by mouth daily.   ibuprofen (ADVIL) 800 MG tablet Take 1 tablet (800 mg total) by mouth every 8 (eight) hours as needed for Pain Tid prn with food   lamoTRIgine (LAMICTAL) 200 MG tablet TAKE 2 TABLETS (400 MG) BY MOUTH 2 TIMES DAILY (Patient taking differently: Take 200 mg by mouth 2 (two) times daily. Takes differently)   levonorgestrel (MIRENA) 20 MCG/24HR IUD 1 each by Intrauterine route once.   LORazepam (ATIVAN) 0.5 MG tablet Take 1 tablet (0.5 mg total) by mouth 2 (two) times daily as needed for Anxiety for up to 180 days   losartan (COZAAR) 25 MG tablet TAKE 1 TABLET BY MOUTH ONCE DAILY   QUEtiapine (SEROQUEL) 100 MG tablet Take 1.5 tablets (150 mg total) by mouth at bedtime.   QUEtiapine (SEROQUEL) 25 MG tablet Take 1 tablet (25 mg total) by mouth at bedtime. Take along with 150 mg at bedtime   tiZANidine (ZANAFLEX) 4 MG tablet 1/2-1 tid prn muscle spasm    Social History: Social History   Tobacco Use   Smoking status: Never  Smokeless tobacco: Never  Vaping Use   Vaping Use: Never used  Substance Use Topics   Alcohol use: No   Drug use: No    Family Medical History: Family History  Problem Relation Age of Onset   Heart disease Mother    Diabetes Maternal Aunt    Breast cancer Maternal Aunt    Osteoporosis Maternal Grandmother    Bipolar disorder Other    Colon cancer Neg Hx    Ovarian cancer Neg Hx     Physical Examination: Vitals:   04/18/22 1310  BP: 126/82    General: Patient is well developed, well nourished, calm, collected, and in no apparent distress. Attention to examination is appropriate.  Psychiatric: Patient is non-anxious.  Head:  Pupils  equal, round, and reactive to light.  ENT:  Oral mucosa appears well hydrated.  Neck:   Supple.  Full range of motion.  Respiratory: Patient is breathing without any difficulty.  Extremities: No edema.  Vascular: Palpable dorsal pedal pulses.  Skin:   On exposed skin, there are no abnormal skin lesions.  NEUROLOGICAL:     Awake, alert, oriented to person, place, and time.  Speech is clear and fluent. Fund of knowledge is appropriate.   Cranial Nerves: Pupils equal round and reactive to light.  Facial tone is symmetric.  Facial sensation is symmetric. Shoulder shrug is symmetric. Tongue protrusion is midline.  There is no pronator drift.  Straight leg raise is positive at 45 degrees on the left  Strength: Side Biceps Triceps Deltoid Interossei Grip Wrist Ext. Wrist Flex.  R 5 5 5 5 5 5 5   L 5 5 5 5 5 5 5    Side Iliopsoas Quads Hamstring PF DF EHL  R 5 5 5 5 5 5   L 5 5 5 5 5 5    Reflexes are 1+ and symmetric at the biceps, triceps, brachioradialis, patella and achilles.   Hoffman's is absent.  Clonus is not present.  Toes are down-going.  Bilateral upper and lower extremity sensation shows diminished sensation in the right leg diffusely due to congenital cerebrovascular accident .    No evidence of dysmetria noted.  Her gait is antalgic  Medical Decision Making  Imaging: MRI L spine 10/14/2021 IMPRESSION: 1. Left subarticular disc protrusion at L5-S1, impinging upon the descending left S1 nerve root in the left lateral recess. 2. Mild degenerative disc disease and facet hypertrophy at L4-5 without significant stenosis or impingement.     Electronically Signed   By: Jeannine Boga M.D.   On: 10/16/2021 04:43  MRI L spine 04/11/2022  IMPRESSION: 1. Partial interval regression of previously seen left subarticular disc protrusion at L5-S1, smaller in size and now somewhat more centrally located as compared to prior. There remains slight posterior displacement  of the descending left S1 nerve root. 2. Mild disc bulge with central annular fissure and facet hypertrophy at L4-5 without significant stenosis or impingement.     Electronically Signed   By: Jeannine Boga M.D.   On: 04/11/2022 18:35  I have personally reviewed the images and agree with the above interpretation.  Assessment and Plan: Ms. Costin is a pleasant 36 y.o. female with lumbar radiculopathy due to disc herniation at L5-S1.  Her symptoms have substantially improved over time.  Her radiographic appearance is also improved and she no longer has severe compression of her left S1 nerve root.  Given her improvement in symptoms, I have recommended against moving forward with surgery.  We will  place her intervention on hold.   She will let me know if her symptoms worsen with time and we will readdress the situation.  As of now, I will see her back on an as-needed basis unless her symptoms return.   I spent a total of 10 minutes in face-to-face and non-face-to-face activities related to this patient's care today.   Thank you for involving me in the care of this patient.      Velina Drollinger K. Myer Haff MD, [facility]

## 2022-04-30 ENCOUNTER — Other Ambulatory Visit: Payer: Self-pay

## 2022-04-30 ENCOUNTER — Telehealth: Payer: No Typology Code available for payment source | Admitting: Physician Assistant

## 2022-04-30 DIAGNOSIS — R6889 Other general symptoms and signs: Secondary | ICD-10-CM | POA: Diagnosis not present

## 2022-04-30 MED ORDER — OSELTAMIVIR PHOSPHATE 75 MG PO CAPS
75.0000 mg | ORAL_CAPSULE | Freq: Two times a day (BID) | ORAL | 0 refills | Status: DC
  Filled 2022-04-30: qty 10, 5d supply, fill #0

## 2022-04-30 NOTE — Progress Notes (Signed)
I have spent 5 minutes in review of e-visit questionnaire, review and updating patient chart, medical decision making and response to patient.   Grey Schlauch Cody Moosa Bueche, PA-C    

## 2022-04-30 NOTE — Progress Notes (Signed)
E visit for Flu like symptoms   We are sorry that you are not feeling well.  Here is how we plan to help! Based on what you have shared with me it looks like you may have a respiratory virus that may be influenza.  Influenza or "the flu" is   an infection caused by a respiratory virus. The flu virus is highly contagious and persons who did not receive their yearly flu vaccination may "catch" the flu from close contact.  We have anti-viral medications to treat the viruses that cause this infection. They are not a "cure" and only shorten the course of the infection. These prescriptions are most effective when they are given within the first 2 days of "flu" symptoms. Antiviral medication are indicated if you have a high risk of complications from the flu. You should  also consider an antiviral medication if you are in close contact with someone who is at risk. These medications can help patients avoid complications from the flu  but have side effects that you should know. Possible side effects from Tamiflu or oseltamivir include nausea, vomiting, diarrhea, dizziness, headaches, eye redness, sleep problems or other respiratory symptoms. You should not take Tamiflu if you have an allergy to oseltamivir or any to the ingredients in Tamiflu.  Based upon your symptoms and potential risk factors I have prescribed Oseltamivir (Tamiflu).  It has been sent to your designated pharmacy.  You will take one 75 mg capsule orally twice a day for the next 5 days.   Please keep well-hydrated and try to get plenty of rest. If you have a humidifier, place it in the bedroom and run it at night. Start a saline nasal rinse for nasal congestion. You can consider use of a nasal steroid spray like Flonase or Nasacort OTC. You can alternate between Tylenol and Ibuprofen if needed for fever, body aches, headache and/or throat pain. Salt water-gargles and chloraseptic spray can be very beneficial for sore throat. Mucinex-DM for  congestion or cough. Please take all prescribed medications as directed.  Remain out of work until fever-free for 24 hours without a fever-reducing medication, and you are feeling better.  You should mask until symptoms are resolved.  If anything worsens despite treatment, you need to be evaluated in-person. Please do not delay care.   ANYONE WHO HAS FLU SYMPTOMS SHOULD: Stay home. The flu is highly contagious and going out or to work exposes others! Be sure to drink plenty of fluids. Water is fine as well as fruit juices, sodas and electrolyte beverages. You may want to stay away from caffeine or alcohol. If you are nauseated, try taking small sips of liquids. How do you know if you are getting enough fluid? Your urine should be a pale yellow or almost colorless. Get rest. Taking a steamy shower or using a humidifier may help nasal congestion and ease sore throat pain. Using a saline nasal spray works much the same way. Cough drops, hard candies and sore throat lozenges may ease your cough. Line up a caregiver. Have someone check on you regularly.   GET HELP RIGHT AWAY IF: You cannot keep down liquids or your medications. You become short of breath Your fell like you are going to pass out or loose consciousness. Your symptoms persist after you have completed your treatment plan MAKE SURE YOU  Understand these instructions. Will watch your condition. Will get help right away if you are not doing well or get worse.  Your e-visit answers   were reviewed by a board certified advanced clinical practitioner to complete your personal care plan.  Depending on the condition, your plan could have included both over the counter or prescription medications.  If there is a problem please reply  once you have received a response from your provider.  Your safety is important to us.  If you have drug allergies check your prescription carefully.    You can use MyChart to ask questions about today's  visit, request a non-urgent call back, or ask for a work or school excuse for 24 hours related to this e-Visit. If it has been greater than 24 hours you will need to follow up with your provider, or enter a new e-Visit to address those concerns.  You will get an e-mail in the next two days asking about your experience.  I hope that your e-visit has been valuable and will speed your recovery. Thank you for using e-visits.  

## 2022-05-02 ENCOUNTER — Telehealth: Payer: Self-pay | Admitting: Psychiatry

## 2022-05-02 NOTE — Telephone Encounter (Signed)
Reviewed ADHD testing report received from [place] attention specialist-dated 11/22/2021.  Patient had Qb test -evidence-based and FDA cleared tool used in the evaluation of patients with ADHD. Motion analysis. Rating scale-MPQ Attention and impulsivity analysis Rating scale-ASRs   Patient met criteria for ADHD combined type.  Patient was prescribed: Quelbree 100 mg.

## 2022-05-03 ENCOUNTER — Other Ambulatory Visit: Payer: Self-pay

## 2022-05-03 ENCOUNTER — Telehealth: Payer: No Typology Code available for payment source | Admitting: Physician Assistant

## 2022-05-03 DIAGNOSIS — R110 Nausea: Secondary | ICD-10-CM

## 2022-05-03 MED ORDER — ONDANSETRON 4 MG PO TBDP
4.0000 mg | ORAL_TABLET | Freq: Three times a day (TID) | ORAL | 0 refills | Status: DC | PRN
  Filled 2022-05-03: qty 20, 7d supply, fill #0

## 2022-05-03 NOTE — Progress Notes (Signed)
E-Visit for Nausea and Vomiting   We are sorry that you are not feeling well. Here is how we plan to help!  Based on what you have shared with me it looks like you have a Virus that is irritating your GI tract.  Vomiting is the forceful emptying of a portion of the stomach's content through the mouth.  Although nausea and vomiting can make you feel miserable, it's important to remember that these are not diseases, but rather symptoms of an underlying illness.  When we treat short term symptoms, we always caution that any symptoms that persist should be fully evaluated in a medical office.  I have prescribed a medication that will help alleviate your symptoms and allow you to stay hydrated:  Zofran 4 mg 1 tablet every 8 hours as needed for nausea and vomiting  HOME CARE: Drink clear liquids.  This is very important! Dehydration (the lack of fluid) can lead to a serious complication.  Start off with 1 tablespoon every 5 minutes for 8 hours. You may begin eating bland foods after 8 hours without vomiting.  Start with saltine crackers, white bread, rice, mashed potatoes, applesauce. After 48 hours on a bland diet, you may resume a normal diet. Try to go to sleep.  Sleep often empties the stomach and relieves the need to vomit.  GET HELP RIGHT AWAY IF:  Your symptoms do not improve or worsen within 2 days after treatment. You have a fever for over 3 days. You cannot keep down fluids after trying the medication.  MAKE SURE YOU:  Understand these instructions. Will watch your condition. Will get help right away if you are not doing well or get worse.    Thank you for choosing an e-visit.  Your e-visit answers were reviewed by a board certified advanced clinical practitioner to complete your personal care plan. Depending upon the condition, your plan could have included both over the counter or prescription medications.  Please review your pharmacy choice. Make sure the pharmacy is open so  you can pick up prescription now. If there is a problem, you may contact your provider through MyChart messaging and have the prescription routed to another pharmacy.  Your safety is important to us. If you have drug allergies check your prescription carefully.   For the next 24 hours you can use MyChart to ask questions about today's visit, request a non-urgent call back, or ask for a work or school excuse. You will get an email in the next two days asking about your experience. I hope that your e-visit has been valuable and will speed your recovery.  I have spent 5 minutes in review of e-visit questionnaire, review and updating patient chart, medical decision making and response to patient.   Dyani Babel M Neythan Kozlov, PA-C  

## 2022-05-16 ENCOUNTER — Ambulatory Visit: Payer: Self-pay | Admitting: Psychiatry

## 2022-05-22 ENCOUNTER — Ambulatory Visit: Admit: 2022-05-22 | Payer: 59 | Admitting: Neurosurgery

## 2022-05-22 SURGERY — LUMBAR LAMINECTOMY/DECOMPRESSION MICRODISCECTOMY 1 LEVEL
Anesthesia: General | Laterality: Left

## 2022-05-28 ENCOUNTER — Telehealth: Payer: Self-pay | Admitting: Psychiatry

## 2022-05-28 NOTE — Telephone Encounter (Signed)
Attempted to contact Briana Ward after I received an email from [place] that she has been trying to contact me to discuss mutual patient.  Had to leave a voicemail.  Left voicemail for Ms. Kellie Simmering to send an email back to [place] regarding a time that will work for her this week or next week so I can call her back at that specific time if possible.

## 2022-06-01 NOTE — Patient Instructions (Signed)
Managing Depression, Adult Depression is a mental health condition that affects your thoughts, feelings, and actions. Being diagnosed with depression can bring you relief if you did not know why you have felt or behaved a certain way. It could also leave you feeling overwhelmed. Finding ways to manage your symptoms can help you feel more positive about your future. How to manage lifestyle changes Being depressed is difficult. Depression can increase the level of everyday stress. Stress can make depression symptoms worse. You may believe your symptoms cannot be managed or will never improve. However, there are many things you can try to help manage your symptoms. There is hope. Managing stress  Stress is your body's reaction to life changes and events, both good and bad. Stress can add to your feelings of depression. Learning to manage your stress can help lessen your feelings of depression. Try some of the following approaches to reducing your stress (stress reduction techniques): Listen to music that you enjoy and that inspires you. Try using a meditation app or take a meditation class. Develop a practice that helps you connect with your spiritual self. Walk in nature, pray, or go to a place of worship. Practice deep breathing. To do this, inhale slowly through your nose. Pause at the top of your inhale for a few seconds and then exhale slowly, letting yourself relax. Repeat this three or four times. Practice yoga to help relax and work your muscles. Choose a stress reduction technique that works for you. These techniques take time and practice to develop. Set aside 5-15 minutes a day to do them. Therapists can offer training in these techniques. Do these things to help manage stress: Keep a journal. Know your limits. Set healthy boundaries for yourself and others, such as saying "no" when you think something is too much. Pay attention to how you react to certain situations. You may not be able to  control everything, but you can change your reaction. Add humor to your life by watching funny movies or shows. Make time for activities that you enjoy and that relax you. Spend less time using electronics, especially at night before bed. The light from screens can make your brain think it is time to get up rather than go to bed.  Medicines Medicines, such as antidepressants, are often a part of treatment for depression. Talk with your pharmacist or health care provider about all the medicines, supplements, and herbal products that you take, their possible side effects, and what medicines and other products are safe to take together. Make sure to report any side effects you may have to your health care provider. Relationships Your health care provider may suggest family therapy, couples therapy, or individual therapy as part of your treatment. How to recognize changes Everyone responds differently to treatment for depression. As you recover from depression, you may start to: Have more interest in doing activities. Feel more hopeful. Have more energy. Eat a more regular amount of food. Have better mental focus. It is important to recognize if your depression is not getting better or is getting worse. The symptoms you had in the beginning may return, such as: Feeling tired. Eating too much or too little. Sleeping too much or too little. Feeling restless, agitated, or hopeless. Trouble focusing or making decisions. Having unexplained aches and pains. Feeling irritable, angry, or aggressive. If you or your family members notice these symptoms coming back, let your health care provider know right away. Follow these instructions at home: Activity Try to   get some form of exercise each day, such as walking. Try yoga, mindfulness, or other stress reduction techniques. Participate in group activities if you are able. Lifestyle Get enough sleep. Cut down on or stop using caffeine, tobacco,  alcohol, and any other harmful substances. Eat a healthy diet that includes plenty of vegetables, fruits, whole grains, low-fat dairy products, and lean protein. Limit foods that are high in solid fats, added sugar, or salt (sodium). General instructions Take over-the-counter and prescription medicines only as told by your health care provider. Keep all follow-up visits. It is important for your health care provider to check on your mood, behavior, and medicines. Your health care provider may need to make changes to your treatment. Where to find support Talking to others  Friends and family members can be sources of support and guidance. Talk to trusted friends or family members about your condition. Explain your symptoms and let them know that you are working with a health care provider to treat your depression. Tell friends and family how they can help. Finances Find mental health providers that fit with your financial situation. Talk with your health care provider if you are worried about access to food, housing, or medicine. Call your insurance company to learn about your co-pays and prescription plan. Where to find more information You can find support in your area from: Anxiety and Depression Association of America (ADAA): adaa.org Mental Health America: mentalhealthamerica.net National Alliance on Mental Illness: nami.org Contact a health care provider if: You stop taking your antidepressant medicines, and you have any of these symptoms: Nausea. Headache. Light-headedness. Chills and body aches. Not being able to sleep (insomnia). You or your friends and family think your depression is getting worse. Get help right away if: You have thoughts of hurting yourself or others. Get help right away if you feel like you may hurt yourself or others, or have thoughts about taking your own life. Go to your nearest emergency room or: Call 911. Call the National Suicide Prevention Lifeline at  1-800-273-8255 or 988. This is open 24 hours a day. Text the Crisis Text Line at 741741. This information is not intended to replace advice given to you by your health care provider. Make sure you discuss any questions you have with your health care provider. Document Revised: 08/28/2021 Document Reviewed: 08/28/2021 Elsevier Patient Education  2023 [employer]vier Inc.  

## 2022-06-05 ENCOUNTER — Encounter: Payer: Self-pay | Admitting: Nurse Practitioner

## 2022-06-05 ENCOUNTER — Ambulatory Visit (INDEPENDENT_AMBULATORY_CARE_PROVIDER_SITE_OTHER): Payer: 59 | Admitting: Nurse Practitioner

## 2022-06-05 ENCOUNTER — Other Ambulatory Visit: Payer: Self-pay

## 2022-06-05 VITALS — BP 100/60 | HR 79 | Temp 98.0°F | Ht 64.02 in | Wt 221.0 lb

## 2022-06-05 DIAGNOSIS — F321 Major depressive disorder, single episode, moderate: Secondary | ICD-10-CM

## 2022-06-05 DIAGNOSIS — E6609 Other obesity due to excess calories: Secondary | ICD-10-CM

## 2022-06-05 DIAGNOSIS — R569 Unspecified convulsions: Secondary | ICD-10-CM

## 2022-06-05 DIAGNOSIS — F439 Reaction to severe stress, unspecified: Secondary | ICD-10-CM | POA: Diagnosis not present

## 2022-06-05 DIAGNOSIS — Z6837 Body mass index (BMI) 37.0-37.9, adult: Secondary | ICD-10-CM | POA: Diagnosis not present

## 2022-06-05 DIAGNOSIS — F39 Unspecified mood [affective] disorder: Secondary | ICD-10-CM | POA: Diagnosis not present

## 2022-06-05 DIAGNOSIS — F411 Generalized anxiety disorder: Secondary | ICD-10-CM | POA: Diagnosis not present

## 2022-06-05 MED ORDER — DULOXETINE HCL 60 MG PO CPEP
60.0000 mg | ORAL_CAPSULE | Freq: Every day | ORAL | 4 refills | Status: DC
  Filled 2022-06-05: qty 90, 90d supply, fill #0
  Filled 2022-08-26: qty 90, 90d supply, fill #1
  Filled 2022-12-11: qty 30, 30d supply, fill #2
  Filled 2022-12-11: qty 90, 90d supply, fill #2
  Filled 2023-01-13: qty 30, 30d supply, fill #3
  Filled 2023-02-04 – 2023-02-19 (×2): qty 30, 30d supply, fill #4
  Filled 2023-03-18: qty 30, 30d supply, fill #5
  Filled 2023-04-16: qty 30, 30d supply, fill #6
  Filled 2023-05-27: qty 30, 30d supply, fill #7

## 2022-06-05 NOTE — Assessment & Plan Note (Signed)
Chronic, ongoing.  Followed by neurology, continue this collaboration and current medication regimen as ordered by them.  Labs in April.

## 2022-06-05 NOTE — Progress Notes (Signed)
BP 100/60   Pulse 79   Temp 98 F (36.7 C) (Oral)   Ht 5' 4.02" (1.626 m)   Wt 221 lb (100.2 kg)   SpO2 98%   BMI 37.92 kg/m    Subjective:    Patient ID: Briana Ward, female    DOB: 04/29/1986, 37 y.o.   MRN: [id number]  HPI: Briana Ward is a 37 y.o. female  Chief Complaint  Patient presents with   Depression    Would like for you to take over prescribing her Cymbalta   DEPRESSION Was followed by psychiatry, last visit on 04/04/22.  She would like PCP to take over Cymbalta prescription and wishes to see if can go up on dosing.  Currently taking 40 MG daily.  Continues Lamictal for seizure disorder, also benefit to mood.  Follows with neurology for this, last visit 04/04/21.  Continues on Seroquel 150 MG nightly.  Is following with therapy, Cheryl.  Mood status: stable Satisfied with current treatment?: yes Symptom severity: moderate  Duration of current treatment : chronic Side effects: no Medication compliance: good compliance Psychotherapy/counseling: yes current Previous psychiatric medications: Seroquel Depressed mood:  occasional Anxious mood: yes Anhedonia: no Significant weight loss or gain: no Insomnia: yes hard to fall asleep Fatigue: no Feelings of worthlessness or guilt: no Impaired concentration/indecisiveness: no Suicidal ideations: no Hopelessness: no Crying spells: yes    06/05/2022    1:57 PM 04/04/2022    1:02 PM 02/07/2022    1:58 PM 12/24/2021    3:41 PM 11/14/2021    8:51 AM  Depression screen PHQ 2/9  Decreased Interest 1 0 1 2 3   Down, Depressed, Hopeless 1 1 1 3 3   PHQ - 2 Score 2 1 2 5 6   Altered sleeping 0 0 1 1 3   Tired, decreased energy 1 1 1 1 2   Change in appetite 1 1 1 1 2   Feeling bad or failure about yourself  2 1 1 3 3   Trouble concentrating 0 0 0 0 2  Moving slowly or fidgety/restless 0 0 0 0 1  Suicidal thoughts 0 0 0 1 1  PHQ-9 Score 6 4 6 12 20   Difficult doing work/chores Somewhat difficult Somewhat  difficult Somewhat difficult Somewhat difficult Extremely dIfficult       06/05/2022    1:58 PM 04/04/2022    1:03 PM 02/07/2022    1:58 PM 12/24/2021    3:42 PM  GAD 7 : Generalized Anxiety Score  Nervous, Anxious, on Edge 2 3 2 3   Control/stop worrying 1 1 1 3   Worry too much - different things 1 2 1 3   Trouble relaxing 0 0 0 1  Restless 0 1 1 0  Easily annoyed or irritable 3 3 2 3   Afraid - awful might happen 1 1 0 2  Total GAD 7 Score 8 11 7 15   Anxiety Difficulty Somewhat difficult Somewhat difficult Somewhat difficult Somewhat difficult   Relevant past medical, surgical, family and social history reviewed and updated as indicated. Interim medical history since our last visit reviewed. Allergies and medications reviewed and updated.  Review of Systems  Constitutional:  Negative for activity change, appetite change, diaphoresis, fatigue and fever.  Respiratory:  Negative for cough, chest tightness and shortness of breath.   Cardiovascular:  Negative for chest pain, palpitations and leg swelling.  Gastrointestinal: Negative.   Neurological: Negative.   Psychiatric/Behavioral:  Positive for sleep disturbance. Negative for decreased concentration, self-injury and suicidal ideas. The  patient is nervous/anxious.     Per HPI unless specifically indicated above     Objective:    BP 100/60   Pulse 79   Temp 98 F (36.7 C) (Oral)   Ht 5' 4.02" (1.626 m)   Wt 221 lb (100.2 kg)   SpO2 98%   BMI 37.92 kg/m   Wt Readings from Last 3 Encounters:  06/05/22 221 lb (100.2 kg)  04/18/22 227 lb (103 kg)  04/04/22 227 lb 6.4 oz (103.1 kg)    Physical Exam Vitals and nursing note reviewed.  Constitutional:      General: She is awake. She is not in acute distress.    Appearance: She is well-developed and well-groomed. She is obese. She is not ill-appearing or toxic-appearing.  HENT:     Head: Normocephalic.     Right Ear: Hearing normal.     Left Ear: Hearing normal.  Eyes:      General: Lids are normal.        Right eye: No discharge.        Left eye: No discharge.     Conjunctiva/sclera: Conjunctivae normal.     Pupils: Pupils are equal, round, and reactive to light.  Neck:     Thyroid: No thyromegaly.     Vascular: No carotid bruit.  Cardiovascular:     Rate and Rhythm: Normal rate and regular rhythm.     Heart sounds: Normal heart sounds. No murmur heard.    No gallop.  Pulmonary:     Effort: Pulmonary effort is normal. No accessory muscle usage or respiratory distress.     Breath sounds: Normal breath sounds.  Abdominal:     General: Bowel sounds are normal.     Palpations: Abdomen is soft.  Musculoskeletal:     Cervical back: Normal range of motion and neck supple.     Right lower leg: No edema.     Left lower leg: No edema.  Lymphadenopathy:     Cervical: No cervical adenopathy.  Skin:    General: Skin is warm and dry.  Neurological:     Mental Status: She is alert and oriented to person, place, and time.  Psychiatric:        Attention and Perception: Attention normal.        Mood and Affect: Mood normal.        Speech: Speech normal.        Behavior: Behavior normal. Behavior is cooperative.        Thought Content: Thought content normal.     Results for orders placed or performed in visit on 08/29/21  Bayer DCA Hb A1c Waived  Result Value Ref Range   HB A1C (BAYER DCA - WAIVED) 4.7 (L) 4.8 - 5.6 %  Microalbumin, Urine Waived  Result Value Ref Range   Microalb, Ur Waived 10 0 - 19 mg/L   Creatinine, Urine Waived 200 10 - 300 mg/dL   Microalb/Creat Ratio <30 <30 mg/g  CBC with Differential/Platelet  Result Value Ref Range   WBC 7.9 3.4 - 10.8 x10E3/uL   RBC 4.29 3.77 - 5.28 x10E6/uL   Hemoglobin 12.7 11.1 - 15.9 g/dL   Hematocrit 13.0 86.5 - 46.6 %   MCV 87 79 - 97 fL   MCH 29.6 26.6 - 33.0 pg   MCHC 33.9 31.5 - 35.7 g/dL   RDW 78.4 69.6 - 29.5 %   Platelets 270 150 - 450 x10E3/uL   Neutrophils 70 Not Estab. %  Lymphs 22 Not  Estab. %   Monocytes 6 Not Estab. %   Eos 1 Not Estab. %   Basos 1 Not Estab. %   Neutrophils Absolute 5.5 1.4 - 7.0 x10E3/uL   Lymphocytes Absolute 1.8 0.7 - 3.1 x10E3/uL   Monocytes Absolute 0.4 0.1 - 0.9 x10E3/uL   EOS (ABSOLUTE) 0.1 0.0 - 0.4 x10E3/uL   Basophils Absolute 0.0 0.0 - 0.2 x10E3/uL   Immature Granulocytes 0 Not Estab. %   Immature Grans (Abs) 0.0 0.0 - 0.1 x10E3/uL  Comprehensive metabolic panel  Result Value Ref Range   Glucose 86 70 - 99 mg/dL   BUN 9 6 - 20 mg/dL   Creatinine, Ser 0.87 0.57 - 1.00 mg/dL   eGFR 90 >59 mL/min/1.73   BUN/Creatinine Ratio 10 9 - 23   Sodium 140 134 - 144 mmol/L   Potassium 4.1 3.5 - 5.2 mmol/L   Chloride 103 96 - 106 mmol/L   CO2 24 20 - 29 mmol/L   Calcium 8.4 (L) 8.7 - 10.2 mg/dL   Total Protein 6.2 6.0 - 8.5 g/dL   Albumin 4.1 3.8 - 4.8 g/dL   Globulin, Total 2.1 1.5 - 4.5 g/dL   Albumin/Globulin Ratio 2.0 1.2 - 2.2   Bilirubin Total <0.2 0.0 - 1.2 mg/dL   Alkaline Phosphatase 101 44 - 121 IU/L   AST 10 0 - 40 IU/L   ALT 12 0 - 32 IU/L  Lipid Panel w/o Chol/HDL Ratio  Result Value Ref Range   Cholesterol, Total 184 100 - 199 mg/dL   Triglycerides 220 (H) 0 - 149 mg/dL   HDL 42 >39 mg/dL   VLDL Cholesterol Cal 38 5 - 40 mg/dL   LDL Chol Calc (NIH) 104 (H) 0 - 99 mg/dL  TSH  Result Value Ref Range   TSH 2.070 0.450 - 4.500 uIU/mL  Vitamin B12  Result Value Ref Range   Vitamin B-12 445 232 - 1,245 pg/mL  VITAMIN D 25 Hydroxy (Vit-D Deficiency, Fractures)  Result Value Ref Range   Vit D, 25-Hydroxy 15.8 (L) 30.0 - 100.0 ng/mL  Hepatitis C antibody  Result Value Ref Range   Hep C Virus Ab Non Reactive Non Reactive  Lamotrigine level  Result Value Ref Range   Lamotrigine Lvl 7.5 2.0 - 20.0 ug/mL      Assessment & Plan:   Problem List Items Addressed This Visit       Other   Depression, major, single episode, moderate (HCC) - Primary    Chronic, ongoing.  Denis SI/HI today.  Would like PCP to takeover current  medication regimen.  Will increase Duloxetine to 60 MG daily and continue Seroquel at 150 MG nightly for sleep, per psychiatry could consider reduction off Seroquel in future and adding on Doxepin.  Will plan on return in April for labs and EKG.        Relevant Medications   DULoxetine (CYMBALTA) 60 MG capsule   Episodic mood disorder ([place])    Refer to depression plan of care.      GAD (generalized anxiety disorder)    Refer to depression plan of care.      Relevant Medications   DULoxetine (CYMBALTA) 60 MG capsule   Obesity    BMI 37.92.  Recommended eating smaller high protein, low fat meals more frequently and exercising 30 mins a day 5 times a week with a goal of 10-15lb weight loss in the next 3 months. Patient voiced their understanding and motivation to  adhere to these recommendations.       Seizure ([place])    Chronic, ongoing.  Followed by neurology, continue this collaboration and current medication regimen as ordered by them.  Labs in April.      Trauma and stressor-related disorder    Refer to depression plan of care.      Relevant Medications   DULoxetine (CYMBALTA) 60 MG capsule     Follow up plan: Return in about 3 months (around 09/02/2022) for Annual Physical.

## 2022-06-05 NOTE — Assessment & Plan Note (Signed)
Refer to depression plan of care.

## 2022-06-05 NOTE — Assessment & Plan Note (Signed)
Chronic, ongoing.  Denis SI/HI today.  Would like PCP to takeover current medication regimen.  Will increase Duloxetine to 60 MG daily and continue Seroquel at 150 MG nightly for sleep, per psychiatry could consider reduction off Seroquel in future and adding on Doxepin.  Will plan on return in April for labs and EKG.

## 2022-06-05 NOTE — Assessment & Plan Note (Signed)
BMI 37.92.  Recommended eating smaller high protein, low fat meals more frequently and exercising 30 mins a day 5 times a week with a goal of 10-15lb weight loss in the next 3 months. Patient voiced their understanding and motivation to adhere to these recommendations.

## 2022-06-06 ENCOUNTER — Encounter: Payer: No Typology Code available for payment source | Admitting: Neurosurgery

## 2022-06-18 ENCOUNTER — Other Ambulatory Visit: Payer: Self-pay

## 2022-06-19 ENCOUNTER — Other Ambulatory Visit: Payer: Self-pay

## 2022-06-20 ENCOUNTER — Other Ambulatory Visit: Payer: Self-pay

## 2022-06-20 MED ORDER — LORAZEPAM 0.5 MG PO TABS
0.5000 mg | ORAL_TABLET | Freq: Two times a day (BID) | ORAL | 3 refills | Status: DC | PRN
  Filled 2022-06-20: qty 20, 10d supply, fill #0
  Filled 2022-10-14: qty 20, 10d supply, fill #1

## 2022-07-02 ENCOUNTER — Other Ambulatory Visit: Payer: Self-pay

## 2022-07-02 MED ORDER — QUETIAPINE FUMARATE 100 MG PO TABS
150.0000 mg | ORAL_TABLET | Freq: Every day | ORAL | 4 refills | Status: DC
  Filled 2022-07-02: qty 135, 90d supply, fill #0
  Filled 2022-08-26 – 2022-08-27 (×2): qty 135, 90d supply, fill #1

## 2022-07-02 NOTE — Telephone Encounter (Signed)
Medication refill for Seroquel last ov 06/05/22, upcoming ov 09/04/22 . Please advise

## 2022-07-04 ENCOUNTER — Encounter: Payer: No Typology Code available for payment source | Admitting: Neurosurgery

## 2022-08-08 ENCOUNTER — Other Ambulatory Visit: Payer: Self-pay

## 2022-08-08 DIAGNOSIS — F32A Depression, unspecified: Secondary | ICD-10-CM | POA: Diagnosis not present

## 2022-08-08 DIAGNOSIS — R030 Elevated blood-pressure reading, without diagnosis of hypertension: Secondary | ICD-10-CM | POA: Diagnosis not present

## 2022-08-08 DIAGNOSIS — F419 Anxiety disorder, unspecified: Secondary | ICD-10-CM | POA: Diagnosis not present

## 2022-08-08 DIAGNOSIS — R569 Unspecified convulsions: Secondary | ICD-10-CM | POA: Diagnosis not present

## 2022-08-08 MED ORDER — DULOXETINE HCL 30 MG PO CPEP
30.0000 mg | ORAL_CAPSULE | Freq: Every day | ORAL | 11 refills | Status: DC
  Filled 2022-08-08: qty 30, 30d supply, fill #0
  Filled 2022-08-26: qty 30, 30d supply, fill #1

## 2022-08-15 ENCOUNTER — Encounter: Payer: No Typology Code available for payment source | Admitting: Neurosurgery

## 2022-08-26 ENCOUNTER — Other Ambulatory Visit: Payer: Self-pay

## 2022-08-26 MED ORDER — LAMOTRIGINE 200 MG PO TABS
400.0000 mg | ORAL_TABLET | Freq: Every day | ORAL | 3 refills | Status: DC
  Filled 2022-08-26: qty 180, 90d supply, fill #0
  Filled 2023-01-13: qty 60, 30d supply, fill #1
  Filled 2023-02-05: qty 60, 30d supply, fill #2
  Filled 2023-04-14: qty 60, 30d supply, fill #3
  Filled 2023-05-27: qty 60, 30d supply, fill #4

## 2022-08-27 ENCOUNTER — Other Ambulatory Visit: Payer: Self-pay

## 2022-08-27 MED ORDER — DULOXETINE HCL 30 MG PO CPEP
30.0000 mg | ORAL_CAPSULE | Freq: Every day | ORAL | 11 refills | Status: DC
  Filled 2022-08-27: qty 30, 30d supply, fill #0
  Filled 2022-10-16: qty 30, 30d supply, fill #1
  Filled 2022-11-11: qty 30, 30d supply, fill #2
  Filled 2022-12-23: qty 30, 30d supply, fill #3
  Filled 2023-02-04: qty 30, 30d supply, fill #4
  Filled 2023-03-18: qty 30, 30d supply, fill #5
  Filled 2023-04-14: qty 30, 30d supply, fill #6
  Filled 2023-05-27: qty 30, 30d supply, fill #7
  Filled 2023-07-02: qty 30, 30d supply, fill #8

## 2022-08-27 MED ORDER — LOSARTAN POTASSIUM 25 MG PO TABS
25.0000 mg | ORAL_TABLET | Freq: Every day | ORAL | 4 refills | Status: DC
  Filled 2022-08-27: qty 90, 90d supply, fill #0
  Filled 2022-12-23: qty 30, 30d supply, fill #1
  Filled 2023-01-16 – 2023-02-04 (×2): qty 30, 30d supply, fill #2
  Filled 2023-03-18: qty 30, 30d supply, fill #3
  Filled 2023-04-14: qty 30, 30d supply, fill #4
  Filled 2023-05-27: qty 30, 30d supply, fill #5
  Filled 2023-07-02: qty 30, 30d supply, fill #6
  Filled 2023-08-07: qty 30, 30d supply, fill #7

## 2022-08-27 MED ORDER — QUETIAPINE FUMARATE 100 MG PO TABS
150.0000 mg | ORAL_TABLET | Freq: Every day | ORAL | 4 refills | Status: DC
  Filled 2022-08-27 – 2022-08-29 (×2): qty 135, 90d supply, fill #0
  Filled 2022-11-11: qty 45, 30d supply, fill #0

## 2022-08-27 NOTE — Telephone Encounter (Signed)
Medication refill forLosartan, Cymbalta 30 mg and Seroquel,  last ov 06/05/22 up coming ov 09/04/22 . Please advise

## 2022-08-29 ENCOUNTER — Other Ambulatory Visit: Payer: Self-pay

## 2022-09-02 ENCOUNTER — Other Ambulatory Visit: Payer: Self-pay

## 2022-09-04 ENCOUNTER — Encounter: Payer: 59 | Admitting: Nurse Practitioner

## 2022-10-14 ENCOUNTER — Other Ambulatory Visit: Payer: Self-pay

## 2022-10-16 ENCOUNTER — Other Ambulatory Visit: Payer: Self-pay

## 2022-10-21 ENCOUNTER — Telehealth (INDEPENDENT_AMBULATORY_CARE_PROVIDER_SITE_OTHER): Payer: Self-pay | Admitting: Family Medicine

## 2022-10-21 ENCOUNTER — Other Ambulatory Visit: Payer: Self-pay

## 2022-10-21 ENCOUNTER — Encounter: Payer: Self-pay | Admitting: Family Medicine

## 2022-10-21 ENCOUNTER — Encounter: Payer: Self-pay | Admitting: Nurse Practitioner

## 2022-10-21 ENCOUNTER — Ambulatory Visit
Admission: RE | Admit: 2022-10-21 | Discharge: 2022-10-21 | Disposition: A | Discharge status: Home / Self Care | Payer: Managed Care, Other (non HMO) | Source: Ambulatory Visit | Attending: Family Medicine | Admitting: Family Medicine

## 2022-10-21 DIAGNOSIS — M25572 Pain in left ankle and joints of left foot: Secondary | ICD-10-CM | POA: Insufficient documentation

## 2022-10-21 MED ORDER — NAPROXEN 500 MG PO TABS
500.0000 mg | ORAL_TABLET | Freq: Two times a day (BID) | ORAL | 0 refills | Status: DC | PRN
Start: 2022-10-21 — End: 2024-03-15
  Filled 2022-10-21: qty 30, 15d supply, fill #0

## 2022-10-21 NOTE — Progress Notes (Cosign Needed)
There were no vitals taken for this visit.   Subjective:    Patient ID: Briana Ward, female    DOB: [birth year], 37 y.o.   MRN: [id number]  HPI: Briana Ward is a 37 y.o. female  Chief Complaint  Patient presents with   Ankle Pain    Left ankle happened on Saturday evening, stepped wrong going down stairs, and swollen and pain    "This visit was completed via video. All issues as above were discussed and addressed but no physical exam was performed. If it was felt that the patient should be evaluated in the office, they were directed there. The patient verbally consented to this visit. Due to the catastrophic nature of the COVID-19 pandemic, this visit was done through video.","This visit was completed via video visit through MyChart due to the restrictions of the COVID-19 pandemic. All issues as above were discussed and addressed. Physical exam was done as above through visual confirmation on video through MyChart. If it was felt that the patient should be evaluated in the office, they were directed there. The patient verbally consented to this visit."} Location of the patient: home Location of the provider: work Those involved with this call:  Provider:  Prescott Gum, NP CMA: Tristan Schroeder, CMA Front Desk/Registration:  Tarry Kos   Time spent on call:  10 minutes with patient face to face via video conference. More than 50% of this time was spent in counseling and coordination of care. 10 minutes total spent in review of patient's record and preparation of their chart.   LEFT ANKLE PAIN She complains of twisting her left ankle after stepping down stairs 2 days ago.  Duration:2 days Involved foot: left lateral ankle  Mechanism of injury:  going down stairs, twisted ankle Location: Left lateral ankle Onset: sudden  Severity: 6/10  Quality:  sharp and throbbing Radiation: no Aggravating factors: weight bearing, walking, bending, and movement  Alleviating factors:  elevation with swelling but pain remains Status: stable Treatments attempted:800 mg ibuprofen , no relief Relief with NSAIDs?:  no Weakness with weight bearing or walking: no Morning stiffness: no Swelling: yes Redness: no Bruising: no Paresthesias / decreased sensation: no  Fevers:no  Relevant past medical, surgical, family and social history reviewed and updated as indicated. Interim medical history since our last visit reviewed. Allergies and medications reviewed and updated.  Review of Systems  Constitutional:  Negative for fever.  Respiratory: Negative.    Cardiovascular: Negative.   Musculoskeletal:  Positive for arthralgias. Negative for back pain, gait problem, joint swelling, myalgias, neck pain and neck stiffness.       Left ankle pain  Skin:  Negative for color change.       Left ankle edema  Neurological:  Negative for numbness.    Per HPI unless specifically indicated above     Objective:    There were no vitals taken for this visit.  Wt Readings from Last 3 Encounters:  06/05/22 221 lb (100.2 kg)  04/18/22 227 lb (103 kg)  11/09/21 216 lb (98 kg)      Results for orders placed or performed in visit on 08/29/21  Bayer DCA Hb A1c Waived  Result Value Ref Range   HB A1C (BAYER DCA - WAIVED) 4.7 (L) 4.8 - 5.6 %  Microalbumin, Urine Waived  Result Value Ref Range   Microalb, Ur Waived 10 0 - 19 mg/L   Creatinine, Urine Waived 200 10 - 300 mg/dL   Microalb/Creat Ratio <30 <  30 mg/g  CBC with Differential/Platelet  Result Value Ref Range   WBC 7.9 3.4 - 10.8 x10E3/uL   RBC 4.29 3.77 - 5.28 x10E6/uL   Hemoglobin 12.7 11.1 - 15.9 g/dL   Hematocrit 40.9 81.1 - 46.6 %   MCV 87 79 - 97 fL   MCH 29.6 26.6 - 33.0 pg   MCHC 33.9 31.5 - 35.7 g/dL   RDW 91.4 78.2 - 95.6 %   Platelets 270 150 - 450 x10E3/uL   Neutrophils 70 Not Estab. %   Lymphs 22 Not Estab. %   Monocytes 6 Not Estab. %   Eos 1 Not Estab. %   Basos 1 Not Estab. %   Neutrophils Absolute 5.5  1.4 - 7.0 x10E3/uL   Lymphocytes Absolute 1.8 0.7 - 3.1 x10E3/uL   Monocytes Absolute 0.4 0.1 - 0.9 x10E3/uL   EOS (ABSOLUTE) 0.1 0.0 - 0.4 x10E3/uL   Basophils Absolute 0.0 0.0 - 0.2 x10E3/uL   Immature Granulocytes 0 Not Estab. %   Immature Grans (Abs) 0.0 0.0 - 0.1 x10E3/uL  Comprehensive metabolic panel  Result Value Ref Range   Glucose 86 70 - 99 mg/dL   BUN 9 6 - 20 mg/dL   Creatinine, Ser 2.13 0.57 - 1.00 mg/dL   eGFR 90 >08 MV/HQI/6.96   BUN/Creatinine Ratio 10 9 - 23   Sodium 140 134 - 144 mmol/L   Potassium 4.1 3.5 - 5.2 mmol/L   Chloride 103 96 - 106 mmol/L   CO2 24 20 - 29 mmol/L   Calcium 8.4 (L) 8.7 - 10.2 mg/dL   Total Protein 6.2 6.0 - 8.5 g/dL   Albumin 4.1 3.8 - 4.8 g/dL   Globulin, Total 2.1 1.5 - 4.5 g/dL   Albumin/Globulin Ratio 2.0 1.2 - 2.2   Bilirubin Total <0.2 0.0 - 1.2 mg/dL   Alkaline Phosphatase 101 44 - 121 IU/L   AST 10 0 - 40 IU/L   ALT 12 0 - 32 IU/L  Lipid Panel w/o Chol/HDL Ratio  Result Value Ref Range   Cholesterol, Total 184 100 - 199 mg/dL   Triglycerides 295 (H) 0 - 149 mg/dL   HDL 42 >28 mg/dL   VLDL Cholesterol Cal 38 5 - 40 mg/dL   LDL Chol Calc (NIH) 413 (H) 0 - 99 mg/dL  TSH  Result Value Ref Range   TSH 2.070 0.450 - 4.500 uIU/mL  Vitamin B12  Result Value Ref Range   Vitamin B-12 445 232 - 1,245 pg/mL  VITAMIN D 25 Hydroxy (Vit-D Deficiency, Fractures)  Result Value Ref Range   Vit D, 25-Hydroxy 15.8 (L) 30.0 - 100.0 ng/mL  Hepatitis C antibody  Result Value Ref Range   Hep C Virus Ab Non Reactive Non Reactive  Lamotrigine level  Result Value Ref Range   Lamotrigine Lvl 7.5 2.0 - 20.0 ug/mL      Assessment & Plan:   Problem List Items Addressed This Visit   None Visit Diagnoses     Acute left ankle pain    -  Primary   Acute, ongoing. Left ankle xray obtained, rule out fracture vs sprain. Naproxen 500 mg BID PRN. Recommend continued rest, ice, and elevation for edema and pain.   Relevant Medications   naproxen  (NAPROSYN) 500 MG tablet   Other Relevant Orders   DG Ankle Complete Left        Follow up plan: Return if symptoms worsen or fail to improve.

## 2022-10-21 NOTE — Addendum Note (Signed)
Addended by: Prescott Gum on: 10/21/2022 04:46 PM   Modules accepted: Level of Service

## 2022-10-21 NOTE — Patient Instructions (Addendum)
Rest. Ice. Compression. Elevation

## 2022-10-22 ENCOUNTER — Telehealth: Payer: Self-pay

## 2022-10-22 NOTE — Telephone Encounter (Signed)
Spoke with patient and states that the naproxen has not helped very much.

## 2022-10-22 NOTE — Telephone Encounter (Signed)
Patient called and would like something for the swelling in her left foot, States that her toes are getting all numb and tingly.

## 2022-10-30 ENCOUNTER — Encounter: Payer: Self-pay | Admitting: Nurse Practitioner

## 2022-11-05 ENCOUNTER — Other Ambulatory Visit: Payer: Self-pay

## 2022-11-11 ENCOUNTER — Other Ambulatory Visit: Payer: Self-pay

## 2022-12-11 ENCOUNTER — Other Ambulatory Visit: Payer: Self-pay

## 2022-12-24 ENCOUNTER — Other Ambulatory Visit: Payer: Self-pay

## 2023-01-13 ENCOUNTER — Other Ambulatory Visit: Payer: Self-pay

## 2023-01-13 MED ORDER — IBUPROFEN 800 MG PO TABS
800.0000 mg | ORAL_TABLET | Freq: Three times a day (TID) | ORAL | 0 refills | Status: DC | PRN
  Filled 2023-01-13: qty 60, 20d supply, fill #0

## 2023-01-17 ENCOUNTER — Other Ambulatory Visit: Payer: Self-pay

## 2023-01-29 ENCOUNTER — Other Ambulatory Visit: Payer: Self-pay

## 2023-02-04 ENCOUNTER — Other Ambulatory Visit: Payer: Self-pay

## 2023-02-05 ENCOUNTER — Other Ambulatory Visit: Payer: Self-pay

## 2023-03-15 IMAGING — MR MR LUMBAR SPINE W/O CM
5 series · 30 of 48 positions shown · non-contrast
Comparison: None Available.

CLINICAL DATA: Initial evaluation for chronic right hip and leg
pain. New onset left-sided symptoms.

EXAM:
MRI LUMBAR SPINE WITHOUT CONTRAST
TECHNIQUE: Multiplanar, multisequence MR imaging of the lumbar spine was
performed. No intravenous contrast was administered.

[Series 5: T2 · sagittal · 4.0mm · 0.81mm/px · 6 of 17 slices shown (1 of 2)]
[im 1/17]
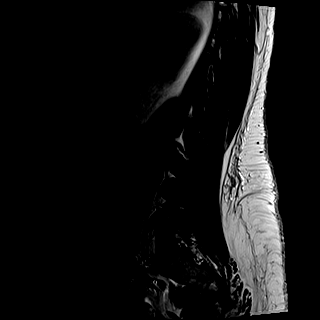
[im 4/17]
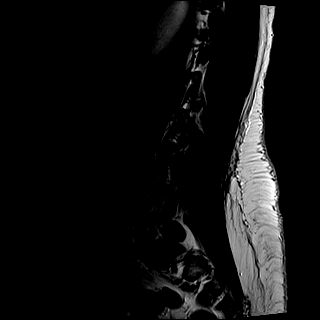
[im 7/17]
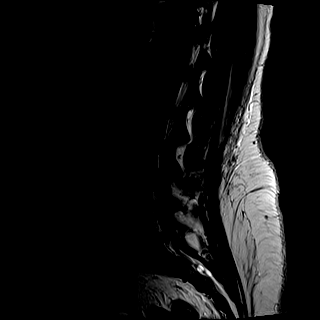
[im 10/17]
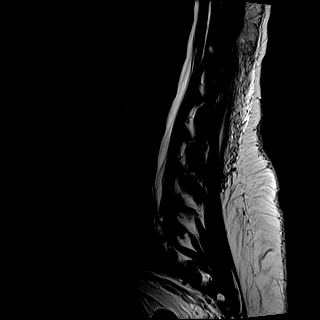
[im 13/17]
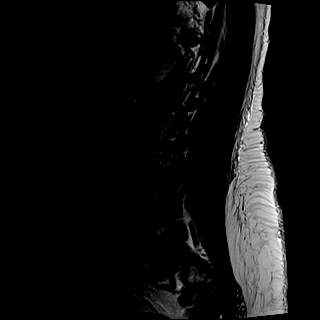
[im 17/17]
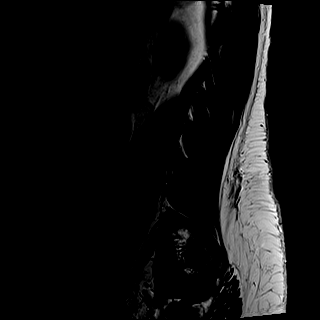

[Series 6: T1 · sagittal · 4.0mm · 0.81mm/px · 7 of 17 slices shown (1 of 2)]
[im 1/17]
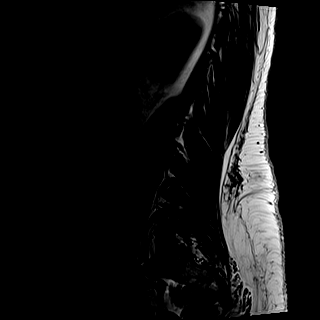
[im 3/17]
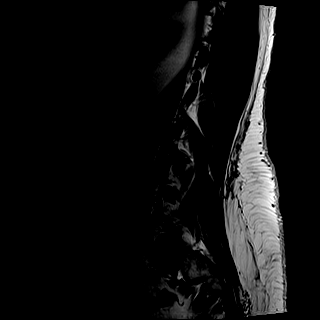
[im 6/17]
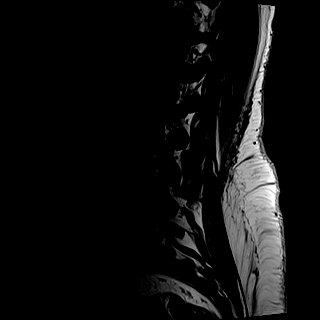
[im 9/17]
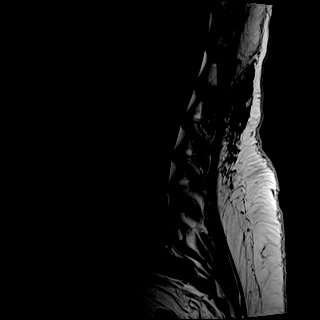
[im 11/17]
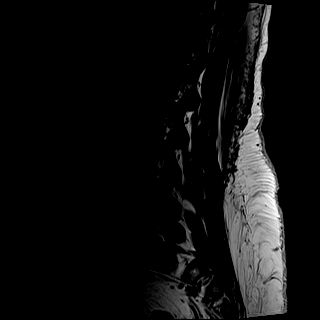
[im 14/17]
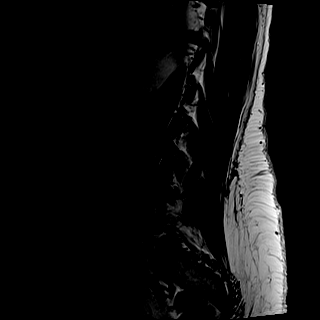
[im 17/17]
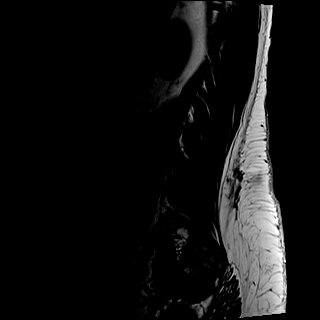

[Series 7: STIR · sagittal · 4.0mm · 0.41mm/px · 1 of 17 slices shown]
[im 1/17]
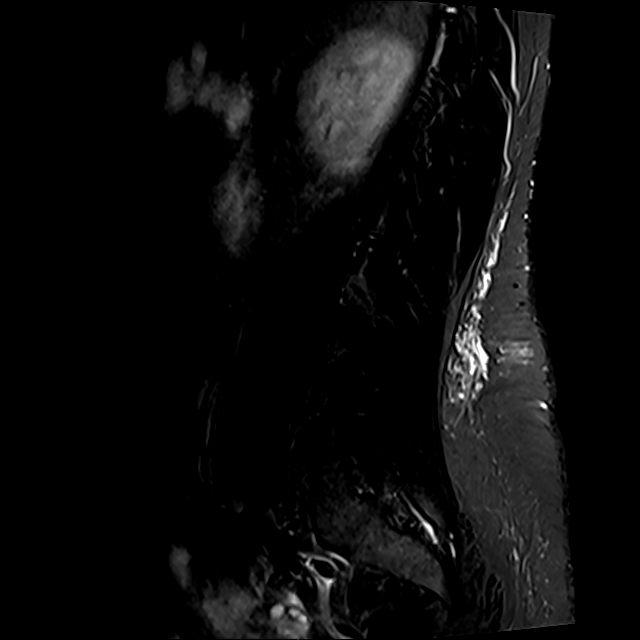

[Series 8: T2 · axial · 4.0mm · 0.78mm/px · z∈[-94,+112]mm · 8 of 34 slices shown (2 of 2)]
[im 1/34]
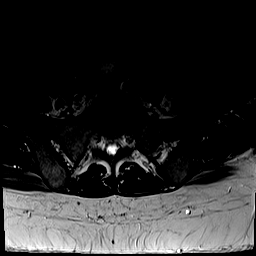
[im 6/34]
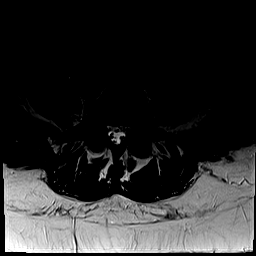
[im 11/34]
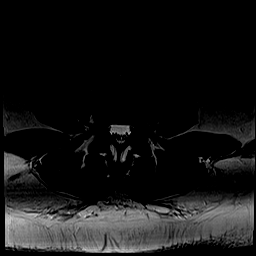
[im 16/34]
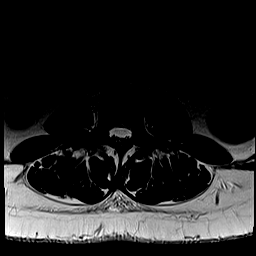
[im 18/34]
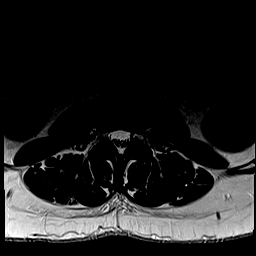
[im 23/34]
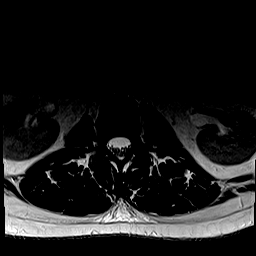
[im 28/34]
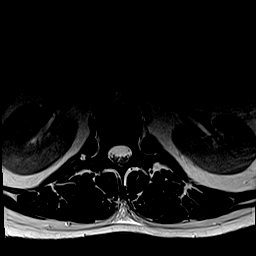
[im 34/34]
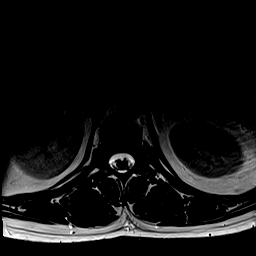

[Series 9: T1 · axial · 4.0mm · 0.39mm/px · z∈[-94,+112]mm · 8 of 34 slices shown (2 of 2)]
[im 1/34]
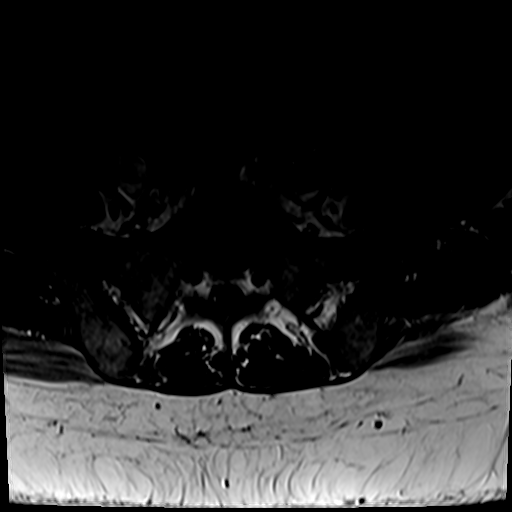
[im 6/34]
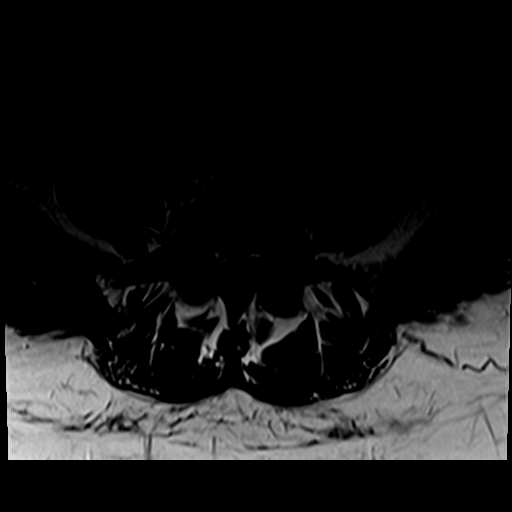
[im 11/34]
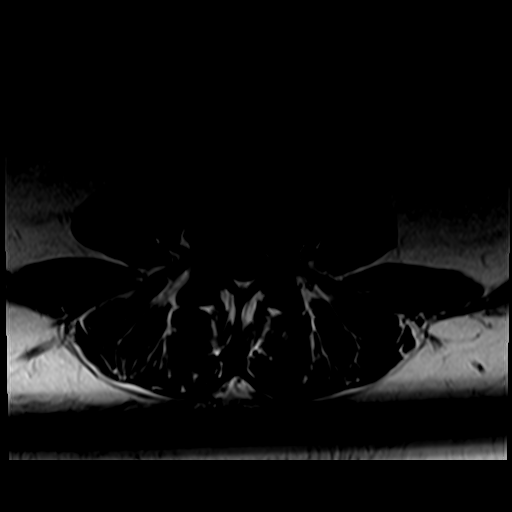
[im 16/34]
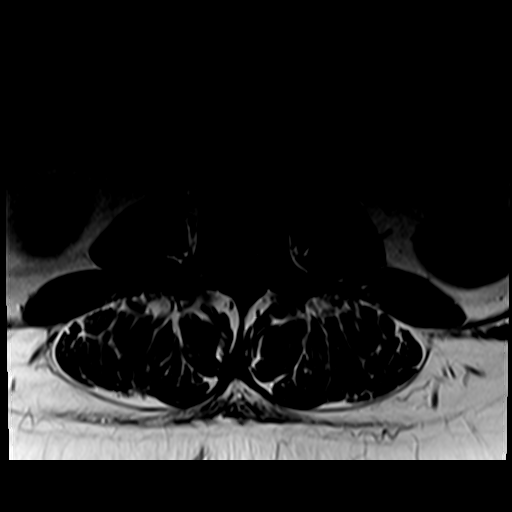
[im 18/34]
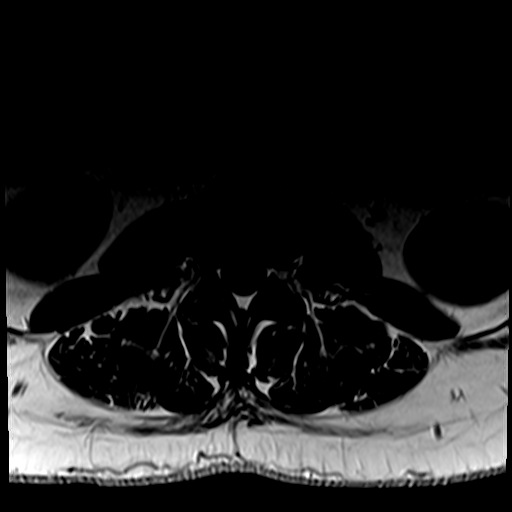
[im 23/34]
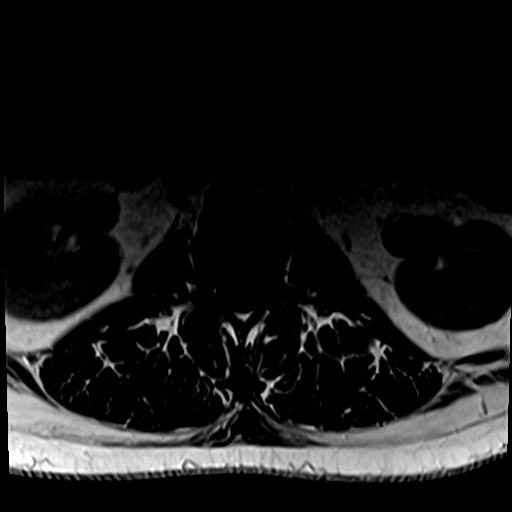
[im 28/34]
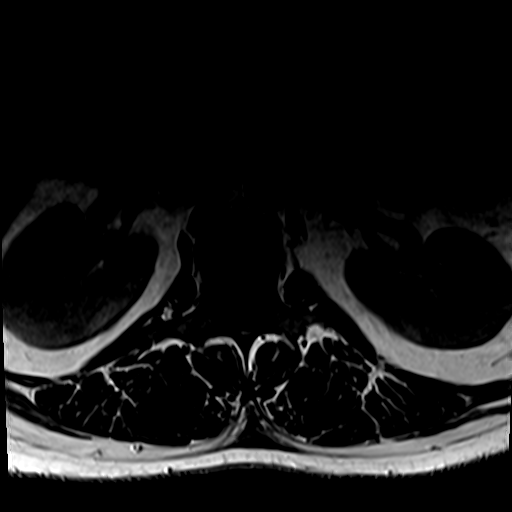
[im 34/34]
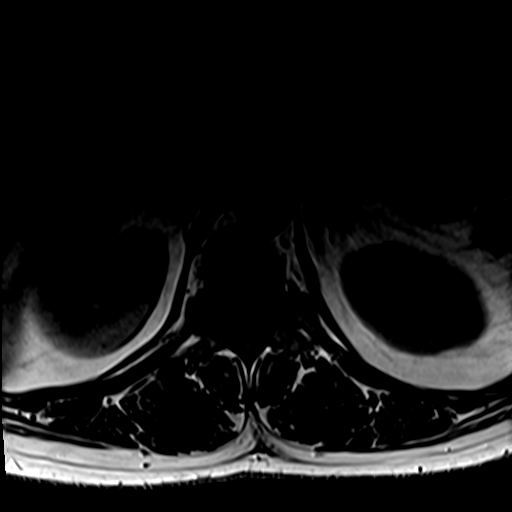

[30 of 48 positions shown; findings below may reference images not displayed]

FINDINGS: Segmentation: Standard. Lowest well-formed disc space labeled the
L5-S1 level.

Alignment: Physiologic with preservation of the normal lumbar
lordosis. No listhesis.

Vertebrae: Vertebral body height maintained without acute or chronic
fracture. Bone marrow signal intensity diffusely decreased on T1
weighted sequence, nonspecific, but most commonly related to anemia,
smoking or obesity. Few scattered benign hemangiomata noted. No
worrisome osseous lesions. No abnormal marrow edema.

Conus medullaris and cauda equina: Conus extends to the T12-L1
level. Conus and cauda equina appear normal.

Paraspinal and other soft tissues: Paraspinous soft tissues within
normal limits. Small approximate 1 cm simple cyst noted within the
right kidney, benign in appearance, no follow-up imaging
recommended. Visualized visceral structures otherwise unremarkable.

Disc levels:

L1-2:  Unremarkable.

L2-3:  Unremarkable.

L3-4:  Unremarkable.

L4-5: Disc desiccation with mild disc bulge. Superimposed tiny
central disc protrusion with slight inferior migration and annular
fissure. Mild facet hypertrophy. No significant canal or lateral
recess stenosis. Foramina remain patent.

L5-S1: Degenerative intervertebral disc space narrowing with diffuse
disc bulge and disc desiccation. Superimposed left subarticular disc
protrusion impinges upon the descending left S1 nerve root in the
left lateral recess (series 8, image 32). Superimposed mild
left-sided facet hypertrophy. Moderate left with mild right lateral
recess stenosis. Central canal remains patent. No significant
foraminal narrowing.
IMPRESSION: 1. Left subarticular disc protrusion at L5-S1, impinging upon the
descending left S1 nerve root in the left lateral recess.
2. Mild degenerative disc disease and facet hypertrophy at L4-5
without significant stenosis or impingement.

## 2023-06-13 ENCOUNTER — Other Ambulatory Visit: Payer: Self-pay

## 2023-06-13 MED ORDER — IBUPROFEN 800 MG PO TABS
800.0000 mg | ORAL_TABLET | Freq: Three times a day (TID) | ORAL | 3 refills | Status: AC
  Filled 2023-06-13: qty 90, 30d supply, fill #0

## 2023-06-24 ENCOUNTER — Other Ambulatory Visit: Payer: Self-pay

## 2023-07-02 ENCOUNTER — Other Ambulatory Visit: Payer: Self-pay

## 2023-07-02 ENCOUNTER — Other Ambulatory Visit: Payer: Self-pay | Admitting: Nurse Practitioner

## 2023-07-03 ENCOUNTER — Encounter: Payer: Self-pay | Admitting: *Deleted

## 2023-07-03 ENCOUNTER — Other Ambulatory Visit: Payer: Self-pay

## 2023-07-03 MED FILL — Duloxetine HCl Enteric Coated Pellets Cap 60 MG (Base Eq): ORAL | 30 days supply | Qty: 30 | Fill #0 | Status: CN

## 2023-07-03 NOTE — Telephone Encounter (Signed)
 Requested medication (s) are due for refill today:   Provider to review  There is a pending order for 07/02/2023 for #90, 4 refills.  Requested medication (s) are on the active medication list:   Yes  Future visit scheduled:   No    LOV in person with Jolene was 06/05/2022.  Had a Virtual appt on 10/21/2022 with Rashelle.   Last ordered: 07/02/2023 #90, 4 refills pended  Returned because it's been pended and labs are due along with OV.   Requested Prescriptions  Pending Prescriptions Disp Refills   DULoxetine (CYMBALTA) 60 MG capsule 90 capsule 4    Sig: Take 1 capsule (60 mg total) by mouth daily.     Psychiatry: Antidepressants - SNRI - duloxetine Failed - 07/03/2023 12:14 PM      Failed - Cr in normal range and within 360 days    Creatinine  Date Value Ref Range Status  01/13/2012 0.62 0.60 - 1.30 mg/dL Final   Creatinine, Ser  Date Value Ref Range Status  08/29/2021 0.87 0.57 - 1.00 mg/dL Final         Failed - eGFR is 30 or above and within 360 days    EGFR (African American)  Date Value Ref Range Status  01/13/2012 >60  Final   GFR calc Af Amer  Date Value Ref Range Status  01/29/2018 >60 >60 mL/min Final    Comment:    (NOTE) The eGFR has been calculated using the CKD EPI equation. This calculation has not been validated in all clinical situations. eGFR's persistently <60 mL/min signify possible Chronic Kidney Disease.    EGFR (Non-African Amer.)  Date Value Ref Range Status  01/13/2012 >60  Final    Comment:    eGFR values <83mL/min/1.73 m2 may be an indication of chronic kidney disease (CKD). Calculated eGFR is useful in patients with stable renal function. The eGFR calculation will not be reliable in acutely ill patients when serum creatinine is changing rapidly. It is not useful in  patients on dialysis. The eGFR calculation may not be applicable to patients at the low and high extremes of body sizes, pregnant women, and vegetarians.    GFR calc non Af  Amer  Date Value Ref Range Status  01/29/2018 >60 >60 mL/min Final   eGFR  Date Value Ref Range Status  08/29/2021 90 >59 mL/min/1.73 Final         Failed - Completed PHQ-2 or PHQ-9 in the last 360 days      Failed - Valid encounter within last 6 months    Recent Outpatient Visits           8 months ago Acute left ankle pain   [place] [facility] [place], Sherran Needs, NP   1 year ago Depression, major, single episode, moderate (HCC)   [facility] [place], [place] T, NP   1 year ago Depression, major, single episode, moderate (HCC)   [place] [facility] [place], Corrie Dandy T, NP   1 year ago Depression, major, single episode, moderate (HCC)   [place] [facility] [place], Corrie Dandy T, NP   1 year ago Seizure [facility]   [place] [facility] [place], [place] T, NP              Passed - Last BP in normal range    BP Readings from Last 1 Encounters:  06/05/22 100/60

## 2023-07-04 ENCOUNTER — Other Ambulatory Visit: Payer: Self-pay

## 2023-07-18 ENCOUNTER — Other Ambulatory Visit: Payer: Self-pay

## 2023-08-11 ENCOUNTER — Other Ambulatory Visit: Payer: Self-pay

## 2023-08-11 MED ORDER — LAMOTRIGINE 200 MG PO TABS
400.0000 mg | ORAL_TABLET | Freq: Every day | ORAL | 1 refills | Status: DC
  Filled 2023-08-11: qty 60, 30d supply, fill #0
  Filled 2023-09-09: qty 60, 30d supply, fill #1
  Filled 2024-01-23: qty 60, 30d supply, fill #2

## 2023-08-13 ENCOUNTER — Other Ambulatory Visit: Payer: Self-pay

## 2023-09-09 ENCOUNTER — Other Ambulatory Visit: Payer: Self-pay

## 2023-09-09 ENCOUNTER — Other Ambulatory Visit: Payer: Self-pay | Admitting: Nurse Practitioner

## 2023-09-10 ENCOUNTER — Other Ambulatory Visit: Payer: Self-pay

## 2023-09-11 ENCOUNTER — Other Ambulatory Visit: Payer: Self-pay

## 2023-09-11 MED FILL — Losartan Potassium Tab 25 MG: ORAL | 30 days supply | Qty: 30 | Fill #0 | Status: AC

## 2023-09-11 NOTE — Telephone Encounter (Signed)
 Requested medication (s) are due for refill today - yes  Requested medication (s) are on the active medication list -yes  Future visit scheduled -no  Last refill: 08/27/22 #90 4RF  Notes to clinic: fails lab protocol-08/29/21  Requested Prescriptions  Pending Prescriptions Disp Refills   losartan  (COZAAR ) 25 MG tablet 90 tablet 4    Sig: Take 1 tablet (25 mg total) by mouth daily.     Cardiovascular:  Angiotensin Receptor Blockers Failed - 09/11/2023  3:48 PM      Failed - Cr in normal range and within 180 days    Creatinine  Date Value Ref Range Status  01/13/2012 0.62 0.60 - 1.30 mg/dL Final   Creatinine, Ser  Date Value Ref Range Status  08/29/2021 0.87 0.57 - 1.00 mg/dL Final         Failed - K in normal range and within 180 days    Potassium  Date Value Ref Range Status  08/29/2021 4.1 3.5 - 5.2 mmol/L Final  01/13/2012 3.8 3.5 - 5.1 mmol/L Final         Failed - Valid encounter within last 6 months    Recent Outpatient Visits   None            Passed - Patient is not pregnant      Passed - Last BP in normal range    BP Readings from Last 1 Encounters:  06/05/22 100/60            Requested Prescriptions  Pending Prescriptions Disp Refills   losartan  (COZAAR ) 25 MG tablet 90 tablet 4    Sig: Take 1 tablet (25 mg total) by mouth daily.     Cardiovascular:  Angiotensin Receptor Blockers Failed - 09/11/2023  3:48 PM      Failed - Cr in normal range and within 180 days    Creatinine  Date Value Ref Range Status  01/13/2012 0.62 0.60 - 1.30 mg/dL Final   Creatinine, Ser  Date Value Ref Range Status  08/29/2021 0.87 0.57 - 1.00 mg/dL Final         Failed - K in normal range and within 180 days    Potassium  Date Value Ref Range Status  08/29/2021 4.1 3.5 - 5.2 mmol/L Final  01/13/2012 3.8 3.5 - 5.1 mmol/L Final         Failed - Valid encounter within last 6 months    Recent Outpatient Visits   None            Passed - Patient is not  pregnant      Passed - Last BP in normal range    BP Readings from Last 1 Encounters:  06/05/22 100/60

## 2023-09-11 NOTE — Telephone Encounter (Signed)
 Patient is overdue for an appointment. Please call to schedule and then route to provider for refill.

## 2023-09-12 ENCOUNTER — Other Ambulatory Visit: Payer: Self-pay

## 2023-09-12 NOTE — Telephone Encounter (Signed)
 Please call to schedule follow up visit per Jolene.

## 2023-09-12 NOTE — Telephone Encounter (Signed)
 Called patient. Left patient message short supply was sent to pharmacy and to call office to schedule appointment for med refill.

## 2023-12-02 ENCOUNTER — Other Ambulatory Visit: Payer: Self-pay

## 2023-12-02 MED ORDER — LAMOTRIGINE 200 MG PO TABS
400.0000 mg | ORAL_TABLET | Freq: Every day | ORAL | 0 refills | Status: AC
  Filled 2023-12-02: qty 60, 30d supply, fill #0

## 2023-12-03 ENCOUNTER — Other Ambulatory Visit: Payer: Self-pay

## 2024-01-23 MED FILL — Losartan Potassium Tab 25 MG: ORAL | 30 days supply | Qty: 30 | Fill #1 | Status: AC

## 2024-03-08 ENCOUNTER — Other Ambulatory Visit: Payer: Self-pay | Admitting: Medical Genetics

## 2024-03-13 NOTE — Patient Instructions (Signed)

## 2024-03-15 ENCOUNTER — Encounter: Payer: Self-pay | Admitting: Nurse Practitioner

## 2024-03-15 ENCOUNTER — Ambulatory Visit: Admitting: Nurse Practitioner

## 2024-03-15 ENCOUNTER — Other Ambulatory Visit: Payer: Self-pay

## 2024-03-15 ENCOUNTER — Other Ambulatory Visit

## 2024-03-15 VITALS — BP 132/84 | HR 80 | Resp 16 | Ht 64.02 in | Wt 238.0 lb

## 2024-03-15 DIAGNOSIS — F411 Generalized anxiety disorder: Secondary | ICD-10-CM

## 2024-03-15 DIAGNOSIS — E66813 Obesity, class 3: Secondary | ICD-10-CM | POA: Diagnosis not present

## 2024-03-15 DIAGNOSIS — E7800 Pure hypercholesterolemia, unspecified: Secondary | ICD-10-CM

## 2024-03-15 DIAGNOSIS — R569 Unspecified convulsions: Secondary | ICD-10-CM | POA: Diagnosis not present

## 2024-03-15 DIAGNOSIS — I10 Essential (primary) hypertension: Secondary | ICD-10-CM

## 2024-03-15 DIAGNOSIS — Z6841 Body Mass Index (BMI) 40.0 and over, adult: Secondary | ICD-10-CM

## 2024-03-15 DIAGNOSIS — F321 Major depressive disorder, single episode, moderate: Secondary | ICD-10-CM

## 2024-03-15 DIAGNOSIS — R21 Rash and other nonspecific skin eruption: Secondary | ICD-10-CM | POA: Insufficient documentation

## 2024-03-15 MED ORDER — BRIMONIDINE TARTRATE 0.33 % EX GEL
CUTANEOUS | 1 refills | Status: DC
  Filled 2024-03-15: qty 30, 30d supply, fill #0

## 2024-03-15 MED ORDER — BUSPIRONE HCL 5 MG PO TABS
5.0000 mg | ORAL_TABLET | Freq: Every evening | ORAL | 3 refills | Status: AC | PRN
  Filled 2024-03-15: qty 60, 30d supply, fill #0

## 2024-03-15 MED ORDER — DULOXETINE HCL 30 MG PO CPEP
ORAL_CAPSULE | ORAL | 3 refills | Status: AC
  Filled 2024-03-15: qty 53, 30d supply, fill #0

## 2024-03-15 MED ORDER — LOSARTAN POTASSIUM 25 MG PO TABS
25.0000 mg | ORAL_TABLET | Freq: Every day | ORAL | 2 refills | Status: AC
Start: 2024-03-15 — End: ?
  Filled 2024-03-15: qty 30, 30d supply, fill #0

## 2024-03-15 NOTE — Assessment & Plan Note (Signed)
 Refer to depression plan of care.

## 2024-03-15 NOTE — Assessment & Plan Note (Signed)
Chronic, ongoing.  Followed by neurology, continue this collaboration and current medication regimen as ordered by them.  Labs today. ?

## 2024-03-15 NOTE — Assessment & Plan Note (Signed)
 On nose and mild to cheeks. ?Rosacea. Will trial Brimonidine 0.33% gel to area daily and see if benefit. If improves then will continue this.

## 2024-03-15 NOTE — Progress Notes (Signed)
 BP 132/84 (BP Location: Left Arm, Patient Position: Sitting, Cuff Size: Large)   Pulse 80   Resp 16   Ht 5' 4.02 (1.626 m)   Wt 238 lb (108 kg)   LMP  (LMP Unknown)   SpO2 98%   BMI 40.83 kg/m    Subjective:    Patient ID: Briana Ward, female    DOB: 1986/04/02, 38 y.o.   MRN: [id number]  HPI: Briana Ward is a 38 y.o. female  Chief Complaint  Patient presents with   Depresssion/Anxiety    Life stressors    Facial redness    Notices around when she would ovulate it increases, is painful and sometimes it also cracks.    DEPRESSION/ANXIETY Currently taking Lamictal , but this is for seizure disorder. Has taken multiple mood medications in past. Has not taken any medication for mood in a couple months. Cymbalta  did offer benefit, but Seroquel  did not help. Increased stressors with her mother having cancer.  Is reporting some facial redness, nose, when she ovulates and this can be painful and crack. Always has a tint of red to nose, but this worsens when ovulates and becomes more red with pimples.  Did see psychiatry in past, last visit was 04/04/22.  Mood status: exacerbated Satisfied with current treatment?: no Symptom severity: moderate  Duration of current treatment : chronic Psychotherapy/counseling: yes in the past Previous psychiatric medications: multiple medications -- Zoloft  put her on edge Depressed mood: yes Anxious mood: yes Anhedonia: occasional Significant weight loss or gain: some gain present Insomnia: yes hard to stay asleep -- varies Fatigue: yes Feelings of worthlessness or guilt: yes Impaired concentration/indecisiveness: yes Suicidal ideations: did once months back, no recent thoughts or plans Hopelessness: no Crying spells: yes    03/15/2024   11:03 AM 06/05/2022    1:57 PM 04/04/2022    1:02 PM 02/07/2022    1:58 PM 12/24/2021    3:41 PM  Depression screen PHQ 2/9  Decreased Interest 2 1     Down, Depressed, Hopeless 2 1     PHQ - 2  Score 4 2     Altered sleeping 3 0     Tired, decreased energy 1 1     Change in appetite 2 1     Feeling bad or failure about yourself  2 2     Trouble concentrating 0 0     Moving slowly or fidgety/restless 0 0     Suicidal thoughts 1 0     PHQ-9 Score 13 6      Difficult doing work/chores Somewhat difficult Somewhat difficult        Information is confidential and restricted. Go to Review Flowsheets to unlock data.   Data saved with a previous flowsheet row definition       03/15/2024   11:04 AM 06/05/2022    1:58 PM 04/04/2022    1:03 PM 02/07/2022    1:58 PM  GAD 7 : Generalized Anxiety Score  Nervous, Anxious, on Edge 2 2    Control/stop worrying 3 1    Worry too much - different things 3 1    Trouble relaxing 1 0    Restless 0 0    Easily annoyed or irritable 3 3    Afraid - awful might happen 1 1    Total GAD 7 Score 13 8    Anxiety Difficulty Somewhat difficult Somewhat difficult       Information is confidential and restricted. Go  to Review Flowsheets to unlock data.   Relevant past medical, surgical, family and social history reviewed and updated as indicated. Interim medical history since our last visit reviewed. Allergies and medications reviewed and updated.  Review of Systems  Constitutional:  Negative for activity change, appetite change, diaphoresis, fatigue and fever.  Respiratory:  Negative for cough, chest tightness and shortness of breath.   Cardiovascular:  Negative for chest pain, palpitations and leg swelling.  Gastrointestinal: Negative.   Skin:  Positive for rash.  Neurological: Negative.   Psychiatric/Behavioral:  Positive for decreased concentration and sleep disturbance. Negative for self-injury and suicidal ideas. The patient is nervous/anxious.     Per HPI unless specifically indicated above     Objective:    BP 132/84 (BP Location: Left Arm, Patient Position: Sitting, Cuff Size: Large)   Pulse 80   Resp 16   Ht 5' 4.02 (1.626 m)    Wt 238 lb (108 kg)   LMP  (LMP Unknown)   SpO2 98%   BMI 40.83 kg/m   Wt Readings from Last 3 Encounters:  03/15/24 238 lb (108 kg)  06/05/22 221 lb (100.2 kg)  04/18/22 227 lb (103 kg)    Physical Exam Vitals and nursing note reviewed.  Constitutional:      General: She is awake. She is not in acute distress.    Appearance: She is well-developed and well-groomed. She is obese. She is not ill-appearing or toxic-appearing.  HENT:     Head: Normocephalic.     Right Ear: Hearing and external ear normal.     Left Ear: Hearing and external ear normal.  Eyes:     General: Lids are normal.        Right eye: No discharge.        Left eye: No discharge.     Conjunctiva/sclera: Conjunctivae normal.     Pupils: Pupils are equal, round, and reactive to light.  Neck:     Thyroid: No thyromegaly.     Vascular: No carotid bruit.  Cardiovascular:     Rate and Rhythm: Normal rate and regular rhythm.     Heart sounds: Normal heart sounds. No murmur heard.    No gallop.  Pulmonary:     Effort: Pulmonary effort is normal. No accessory muscle usage or respiratory distress.     Breath sounds: Normal breath sounds.  Abdominal:     General: Bowel sounds are normal. There is no distension.     Palpations: Abdomen is soft.     Tenderness: There is no abdominal tenderness.  Musculoskeletal:     Cervical back: Normal range of motion and neck supple.     Right lower leg: No edema.     Left lower leg: No edema.  Lymphadenopathy:     Cervical: No cervical adenopathy.  Skin:    General: Skin is warm and dry.     Findings: Rash present.     Comments: Rash to nasal area with some small pimples noted, erythema at base.  A little scattered to both cheeks as well.  Neurological:     Mental Status: She is alert and oriented to person, place, and time.     Deep Tendon Reflexes: Reflexes are normal and symmetric.     Reflex Scores:      Brachioradialis reflexes are 2+ on the right side and 2+ on the  left side.      Patellar reflexes are 2+ on the right side and 2+ on the left  side. Psychiatric:        Attention and Perception: Attention normal.        Mood and Affect: Mood normal.        Speech: Speech normal.        Behavior: Behavior normal. Behavior is cooperative.        Thought Content: Thought content normal.     Results for orders placed or performed in visit on 10/30/22  HM PAP SMEAR   Collection Time: 11/19/21 12:00 AM  Result Value Ref Range   HM Pap smear see report       Assessment & Plan:   Problem List Items Addressed This Visit       Cardiovascular and Mediastinum   Essential hypertension   Chronic, stable with BP close to goal today.  Some recent stressors in life.  Recommend she monitor BP at least a few mornings a week at home and document.  DASH diet at home.  Continue current medication regimen and adjust as needed.  Labs today: TSH, CBC, CMP.      Relevant Medications   losartan  (COZAAR ) 25 MG tablet     Musculoskeletal and Integument   Rash   On nose and mild to cheeks. ?Rosacea. Will trial Brimonidine 0.33% gel to area daily and see if benefit. If improves then will continue this.        Other   Seizure (HCC)   Chronic, ongoing.  Followed by neurology, continue this collaboration and current medication regimen as ordered by them.  Labs today.      Obesity   BMI 40.83.  Recommended eating smaller high protein, low fat meals more frequently and exercising 30 mins a day 5 times a week with a goal of 10-15lb weight loss in the next 3 months. Patient voiced their understanding and motivation to adhere to these recommendations.       GAD (generalized anxiety disorder)   Refer to depression plan of care.      Relevant Medications   DULoxetine  (CYMBALTA ) 30 MG capsule   busPIRone  (BUSPAR ) 5 MG tablet   Other Relevant Orders   TSH   Elevated low density lipoprotein (LDL) cholesterol level   Noted on labs in past -- lipid panel on labs today.   Continue diet and exercise focus.      Relevant Orders   Comprehensive metabolic panel with GFR   Lipid Panel w/o Chol/HDL Ratio   Depression, major, single episode, moderate (HCC) - Primary   Chronic, exacerbated.  Denies SI/HI today.  Will restart Duloxetine  at 30 MG daily for one week and then may increase to 60 MG on week two if tolerating, goal is to return to previous 90 MG dosing. Trial Buspar  5-10 MG at night to help anxiety. In past psychiatry recommended adding on Doxepin, could consider this in future.  Safety plan in place, has family she would reach out to. Strict ER precautions provided if SI presents.      Relevant Medications   DULoxetine  (CYMBALTA ) 30 MG capsule   busPIRone  (BUSPAR ) 5 MG tablet   Other Relevant Orders   CBC with Differential/Platelet   TSH    I personally spent a total of 25 minutes in the care of the patient today including preparing to see the patient, getting/reviewing separately obtained history, performing a medically appropriate exam/evaluation, counseling and educating, and placing orders.   Follow up plan: Return in about 4 weeks (around 04/12/2024) for Depression, ANXIETY - virtual.

## 2024-03-15 NOTE — Assessment & Plan Note (Signed)
 Noted on labs in past -- lipid panel on labs today.  Continue diet and exercise focus.

## 2024-03-15 NOTE — Assessment & Plan Note (Signed)
 BMI 40.83.  Recommended eating smaller high protein, low fat meals more frequently and exercising 30 mins a day 5 times a week with a goal of 10-15lb weight loss in the next 3 months. Patient voiced their understanding and motivation to adhere to these recommendations.

## 2024-03-15 NOTE — Assessment & Plan Note (Addendum)
 Chronic, exacerbated.  Denies SI/HI today.  Will restart Duloxetine  at 30 MG daily for one week and then may increase to 60 MG on week two if tolerating, goal is to return to previous 90 MG dosing. Trial Buspar  5-10 MG at night to help anxiety. In past psychiatry recommended adding on Doxepin, could consider this in future.  Safety plan in place, has family she would reach out to. Strict ER precautions provided if SI presents.

## 2024-03-15 NOTE — Assessment & Plan Note (Signed)
 Chronic, stable with BP close to goal today.  Some recent stressors in life.  Recommend she monitor BP at least a few mornings a week at home and document.  DASH diet at home.  Continue current medication regimen and adjust as needed.  Labs today: TSH, CBC, CMP.

## 2024-03-16 ENCOUNTER — Ambulatory Visit: Payer: Self-pay | Admitting: Nurse Practitioner

## 2024-03-16 ENCOUNTER — Other Ambulatory Visit: Payer: Self-pay

## 2024-03-16 DIAGNOSIS — R21 Rash and other nonspecific skin eruption: Secondary | ICD-10-CM

## 2024-03-16 LAB — COMPREHENSIVE METABOLIC PANEL WITH GFR
ALT: 34 IU/L — ABNORMAL HIGH (ref 0–32)
AST: 23 IU/L (ref 0–40)
Albumin: 4.5 g/dL (ref 3.9–4.9)
Alkaline Phosphatase: 105 IU/L (ref 41–116)
BUN/Creatinine Ratio: 10 (ref 9–23)
BUN: 7 mg/dL (ref 6–20)
Bilirubin Total: 0.4 mg/dL (ref 0.0–1.2)
CO2: 23 mmol/L (ref 20–29)
Calcium: 9.6 mg/dL (ref 8.7–10.2)
Chloride: 98 mmol/L (ref 96–106)
Creatinine, Ser: 0.67 mg/dL (ref 0.57–1.00)
Globulin, Total: 2.4 g/dL (ref 1.5–4.5)
Glucose: 105 mg/dL — ABNORMAL HIGH (ref 70–99)
Potassium: 4.4 mmol/L (ref 3.5–5.2)
Sodium: 136 mmol/L (ref 134–144)
Total Protein: 6.9 g/dL (ref 6.0–8.5)
eGFR: 115 mL/min/1.73 (ref >59)

## 2024-03-16 LAB — CBC WITH DIFFERENTIAL/PLATELET
Basophils Absolute: 0.1 x10E3/uL (ref 0.0–0.2)
Basos: 1 %
EOS (ABSOLUTE): 0.2 x10E3/uL (ref 0.0–0.4)
Eos: 2 %
Hematocrit: 40.9 % (ref 34.0–46.6)
Hemoglobin: 13.4 g/dL (ref 11.1–15.9)
Immature Grans (Abs): 0.1 x10E3/uL (ref 0.0–0.1)
Immature Granulocytes: 1 %
Lymphocytes Absolute: 1.6 x10E3/uL (ref 0.7–3.1)
Lymphs: 16 %
MCH: 29.3 pg (ref 26.6–33.0)
MCHC: 32.8 g/dL (ref 31.5–35.7)
MCV: 89 fL (ref 79–97)
Monocytes Absolute: 0.5 x10E3/uL (ref 0.1–0.9)
Monocytes: 5 %
Neutrophils Absolute: 7.3 x10E3/uL — ABNORMAL HIGH (ref 1.4–7.0)
Neutrophils: 75 %
Platelets: 284 x10E3/uL (ref 150–450)
RBC: 4.58 x10E6/uL (ref 3.77–5.28)
RDW: 13 % (ref 11.7–15.4)
WBC: 9.7 x10E3/uL (ref 3.4–10.8)

## 2024-03-16 LAB — LIPID PANEL W/O CHOL/HDL RATIO
Cholesterol, Total: 238 mg/dL — ABNORMAL HIGH (ref 100–199)
HDL: 47 mg/dL (ref >39)
LDL Chol Calc (NIH): 140 mg/dL — ABNORMAL HIGH (ref 0–99)
Triglycerides: 283 mg/dL — ABNORMAL HIGH (ref 0–149)
VLDL Cholesterol Cal: 51 mg/dL — ABNORMAL HIGH (ref 5–40)

## 2024-03-16 LAB — TSH: TSH: 3.83 u[IU]/mL (ref 0.450–4.500)

## 2024-03-16 NOTE — Progress Notes (Signed)
 Contacted via MyChart  Overall labs are reassuring, a couple mild elevations but these are very mild and we can continue to monitor. Lipid panel does continue to show elevations, but no medications needed at this time. For this recommend focus on diet and exercise. Any questions? Keep being amazing!!  Thank you for allowing me to participate in your care.  I appreciate you. Kindest regards, Ashly Yepez

## 2024-03-20 ENCOUNTER — Other Ambulatory Visit: Payer: Self-pay

## 2024-03-26 ENCOUNTER — Other Ambulatory Visit: Payer: Self-pay

## 2024-03-26 MED ORDER — CLINDAMYCIN PHOS (TWICE-DAILY) 1 % EX GEL
Freq: Two times a day (BID) | CUTANEOUS | 1 refills | Status: AC
  Filled 2024-03-26: qty 30, 30d supply, fill #0

## 2024-03-26 NOTE — Addendum Note (Signed)
 Addended by: Yordi Krager T on: 03/26/2024 01:23 PM   Modules accepted: Orders

## 2024-03-27 ENCOUNTER — Other Ambulatory Visit: Payer: Self-pay

## 2024-04-03 ENCOUNTER — Other Ambulatory Visit: Payer: Self-pay | Attending: Medical Genetics

## 2024-04-13 ENCOUNTER — Ambulatory Visit: Admitting: Dermatology

## 2024-04-18 NOTE — Patient Instructions (Incomplete)
 Managing Depression, Adult Depression is a mental health condition that affects your thoughts, feelings, and actions. Being diagnosed with depression can bring you relief if you did not know why you have felt or behaved a certain way. It could also leave you feeling overwhelmed. Finding ways to manage your symptoms can help you feel more positive about your future. How to manage lifestyle changes Being depressed is difficult. Depression can increase the level of everyday stress. Stress can make depression symptoms worse. You may believe your symptoms cannot be managed or will never improve. However, there are many things you can try to help manage your symptoms. There is hope. Managing stress  Stress is your body's reaction to life changes and events, both good and bad. Stress can add to your feelings of depression. Learning to manage your stress can help lessen your feelings of depression. Try some of the following approaches to reducing your stress (stress reduction techniques): Listen to music that you enjoy and that inspires you. Try using a meditation app or take a meditation class. Develop a practice that helps you connect with your spiritual self. Walk in nature, pray, or go to a place of worship. Practice deep breathing. To do this, inhale slowly through your nose. Pause at the top of your inhale for a few seconds and then exhale slowly, letting yourself relax. Repeat this three or four times. Practice yoga to help relax and work your muscles. Choose a stress reduction technique that works for you. These techniques take time and practice to develop. Set aside 5-15 minutes a day to do them. Therapists can offer training in these techniques. Do these things to help manage stress: Keep a journal. Know your limits. Set healthy boundaries for yourself and others, such as saying "no" when you think something is too much. Pay attention to how you react to certain situations. You may not be able to  control everything, but you can change your reaction. Add humor to your life by watching funny movies or shows. Make time for activities that you enjoy and that relax you. Spend less time using electronics, especially at night before bed. The light from screens can make your brain think it is time to get up rather than go to bed.  Medicines Medicines, such as antidepressants, are often a part of treatment for depression. Talk with your pharmacist or health care provider about all the medicines, supplements, and herbal products that you take, their possible side effects, and what medicines and other products are safe to take together. Make sure to report any side effects you may have to your health care provider. Relationships Your health care provider may suggest family therapy, couples therapy, or individual therapy as part of your treatment. How to recognize changes Everyone responds differently to treatment for depression. As you recover from depression, you may start to: Have more interest in doing activities. Feel more hopeful. Have more energy. Eat a more regular amount of food. Have better mental focus. It is important to recognize if your depression is not getting better or is getting worse. The symptoms you had in the beginning may return, such as: Feeling tired. Eating too much or too little. Sleeping too much or too little. Feeling restless, agitated, or hopeless. Trouble focusing or making decisions. Having unexplained aches and pains. Feeling irritable, angry, or aggressive. If you or your family members notice these symptoms coming back, let your health care provider know right away. Follow these instructions at home: Activity Try to  get some form of exercise each day, such as walking. Try yoga, mindfulness, or other stress reduction techniques. Participate in group activities if you are able. Lifestyle Get enough sleep. Cut down on or stop using caffeine, tobacco,  alcohol, and any other harmful substances. Eat a healthy diet that includes plenty of vegetables, fruits, whole grains, low-fat dairy products, and lean protein. Limit foods that are high in solid fats, added sugar, or salt (sodium). General instructions Take over-the-counter and prescription medicines only as told by your health care provider. Keep all follow-up visits. It is important for your health care provider to check on your mood, behavior, and medicines. Your health care provider may need to make changes to your treatment. Where to find support Talking to others  Friends and family members can be sources of support and guidance. Talk to trusted friends or family members about your condition. Explain your symptoms and let them know that you are working with a health care provider to treat your depression. Tell friends and family how they can help. Finances Find mental health providers that fit with your financial situation. Talk with your health care provider if you are worried about access to food, housing, or medicine. Call your insurance company to learn about your co-pays and prescription plan. Where to find more information You can find support in your area from: Anxiety and Depression Association of America (ADAA): adaa.org Mental Health [place]: mentalhealthamerica.net [employer] on Mental Illness: nami.org Contact a health care provider if: You stop taking your antidepressant medicines, and you have any of these symptoms: Nausea. Headache. Light-headedness. Chills and body aches. Not being able to sleep (insomnia). You or your friends and family think your depression is getting worse. Get help right away if: You have thoughts of hurting yourself or others. Get help right away if you feel like you may hurt yourself or others, or have thoughts about taking your own life. Go to your nearest emergency room or: Call 911. Call the National Suicide Prevention Lifeline at  (215) 435-0408 or 988. This is open 24 hours a day. Text the Crisis Text Line at 318 581 7774. This information is not intended to replace advice given to you by your health care provider. Make sure you discuss any questions you have with your health care provider. Document Revised: 08/28/2021 Document Reviewed: 08/28/2021 Elsevier Patient Education  2024 [employer].

## 2024-04-23 ENCOUNTER — Ambulatory Visit: Admitting: Nurse Practitioner

## 2024-05-03 ENCOUNTER — Ambulatory Visit: Admitting: Dermatology

## 2024-05-17 ENCOUNTER — Ambulatory Visit: Admitting: Dermatology
